# Patient Record
Sex: Female | Born: 1955
Health system: Southern US, Community
[De-identification: ages and names within clinical notes are randomized; demographics above are authoritative.]

## PROBLEM LIST (undated history)

## (undated) DIAGNOSIS — M79604 Pain in right leg: Secondary | ICD-10-CM

## (undated) DIAGNOSIS — I10 Essential (primary) hypertension: Secondary | ICD-10-CM

## (undated) DIAGNOSIS — G8929 Other chronic pain: Secondary | ICD-10-CM

## (undated) DIAGNOSIS — M1990 Unspecified osteoarthritis, unspecified site: Secondary | ICD-10-CM

## (undated) DIAGNOSIS — R0981 Nasal congestion: Secondary | ICD-10-CM

## (undated) DIAGNOSIS — M79605 Pain in left leg: Secondary | ICD-10-CM

## (undated) DIAGNOSIS — Z720 Tobacco use: Secondary | ICD-10-CM

## (undated) HISTORY — DX: Pain in left leg: M79.605

## (undated) HISTORY — DX: Pain in right leg: M79.604

## (undated) HISTORY — DX: Tobacco use: Z72.0

## (undated) HISTORY — DX: Unspecified osteoarthritis, unspecified site: M19.90

## (undated) HISTORY — DX: Nasal congestion: R09.81

## (undated) HISTORY — DX: Other chronic pain: G89.29

## (undated) HISTORY — PX: NASAL SEPTUM SURGERY: SHX37

---

## 1998-04-12 ENCOUNTER — Emergency Department (HOSPITAL_COMMUNITY): Admission: EM | Admit: 1998-04-12 | Discharge: 1998-04-12 | Payer: Self-pay | Admitting: Internal Medicine

## 1998-06-09 ENCOUNTER — Emergency Department (HOSPITAL_COMMUNITY): Admission: EM | Admit: 1998-06-09 | Discharge: 1998-06-09 | Payer: Self-pay

## 1998-09-22 ENCOUNTER — Emergency Department (HOSPITAL_COMMUNITY): Admission: EM | Admit: 1998-09-22 | Discharge: 1998-09-22 | Payer: Self-pay | Admitting: Emergency Medicine

## 1998-09-22 ENCOUNTER — Encounter: Payer: Self-pay | Admitting: Emergency Medicine

## 1999-02-20 ENCOUNTER — Emergency Department (HOSPITAL_COMMUNITY): Admission: EM | Admit: 1999-02-20 | Discharge: 1999-02-20 | Payer: Self-pay | Admitting: Emergency Medicine

## 1999-06-22 ENCOUNTER — Emergency Department (HOSPITAL_COMMUNITY): Admission: EM | Admit: 1999-06-22 | Discharge: 1999-06-22 | Payer: Self-pay | Admitting: Emergency Medicine

## 1999-06-22 ENCOUNTER — Encounter: Payer: Self-pay | Admitting: Emergency Medicine

## 1999-10-09 ENCOUNTER — Emergency Department (HOSPITAL_COMMUNITY): Admission: EM | Admit: 1999-10-09 | Discharge: 1999-10-09 | Payer: Self-pay | Admitting: Emergency Medicine

## 1999-12-15 ENCOUNTER — Emergency Department (HOSPITAL_COMMUNITY): Admission: EM | Admit: 1999-12-15 | Discharge: 1999-12-15 | Payer: Self-pay | Admitting: Emergency Medicine

## 2004-12-25 ENCOUNTER — Emergency Department (HOSPITAL_COMMUNITY): Admission: EM | Admit: 2004-12-25 | Discharge: 2004-12-25 | Payer: Self-pay | Admitting: Emergency Medicine

## 2007-02-07 ENCOUNTER — Emergency Department (HOSPITAL_COMMUNITY): Admission: EM | Admit: 2007-02-07 | Discharge: 2007-02-07 | Payer: Self-pay | Admitting: Emergency Medicine

## 2008-02-08 ENCOUNTER — Emergency Department (HOSPITAL_COMMUNITY): Admission: EM | Admit: 2008-02-08 | Discharge: 2008-02-08 | Payer: Self-pay | Admitting: Emergency Medicine

## 2008-06-19 ENCOUNTER — Emergency Department (HOSPITAL_COMMUNITY): Admission: EM | Admit: 2008-06-19 | Discharge: 2008-06-19 | Payer: Self-pay | Admitting: Emergency Medicine

## 2008-08-10 ENCOUNTER — Emergency Department (HOSPITAL_COMMUNITY): Admission: EM | Admit: 2008-08-10 | Discharge: 2008-08-10 | Payer: Self-pay | Admitting: Emergency Medicine

## 2009-02-07 ENCOUNTER — Emergency Department (HOSPITAL_COMMUNITY): Admission: EM | Admit: 2009-02-07 | Discharge: 2009-02-07 | Payer: Self-pay | Admitting: Emergency Medicine

## 2009-06-10 ENCOUNTER — Emergency Department (HOSPITAL_COMMUNITY): Admission: EM | Admit: 2009-06-10 | Discharge: 2009-06-10 | Payer: Self-pay | Admitting: Emergency Medicine

## 2009-08-26 ENCOUNTER — Emergency Department (HOSPITAL_COMMUNITY): Admission: EM | Admit: 2009-08-26 | Discharge: 2009-08-26 | Payer: Self-pay | Admitting: Emergency Medicine

## 2010-08-29 ENCOUNTER — Emergency Department (HOSPITAL_COMMUNITY): Admission: EM | Admit: 2010-08-29 | Discharge: 2010-01-02 | Payer: Self-pay | Admitting: Emergency Medicine

## 2011-04-10 ENCOUNTER — Emergency Department (HOSPITAL_COMMUNITY)
Admission: EM | Admit: 2011-04-10 | Discharge: 2011-04-10 | Disposition: A | Discharge status: Home / Self Care | Payer: Self-pay | Attending: Emergency Medicine | Admitting: Emergency Medicine

## 2011-04-10 ENCOUNTER — Emergency Department (HOSPITAL_COMMUNITY): Payer: Self-pay

## 2011-04-10 DIAGNOSIS — R5383 Other fatigue: Secondary | ICD-10-CM | POA: Insufficient documentation

## 2011-04-10 DIAGNOSIS — J40 Bronchitis, not specified as acute or chronic: Secondary | ICD-10-CM | POA: Insufficient documentation

## 2011-04-10 DIAGNOSIS — J3489 Other specified disorders of nose and nasal sinuses: Secondary | ICD-10-CM | POA: Insufficient documentation

## 2011-04-10 DIAGNOSIS — R6883 Chills (without fever): Secondary | ICD-10-CM | POA: Insufficient documentation

## 2011-04-10 DIAGNOSIS — R05 Cough: Secondary | ICD-10-CM | POA: Insufficient documentation

## 2011-04-10 DIAGNOSIS — R059 Cough, unspecified: Secondary | ICD-10-CM | POA: Insufficient documentation

## 2011-04-10 DIAGNOSIS — R5381 Other malaise: Secondary | ICD-10-CM | POA: Insufficient documentation

## 2011-04-10 DIAGNOSIS — J329 Chronic sinusitis, unspecified: Secondary | ICD-10-CM | POA: Insufficient documentation

## 2011-04-10 DIAGNOSIS — R112 Nausea with vomiting, unspecified: Secondary | ICD-10-CM | POA: Insufficient documentation

## 2011-06-18 LAB — URINALYSIS, ROUTINE W REFLEX MICROSCOPIC
Glucose, UA: NEGATIVE
Nitrite: NEGATIVE
Specific Gravity, Urine: 1.029
pH: 7

## 2011-06-18 LAB — URINE MICROSCOPIC-ADD ON

## 2011-06-23 LAB — CBC
HCT: 40.1
Platelets: 542 — ABNORMAL HIGH
RDW: 13.2
WBC: 11.4 — ABNORMAL HIGH

## 2011-06-23 LAB — DIFFERENTIAL
Basophils Absolute: 0
Eosinophils Relative: 0
Lymphocytes Relative: 11 — ABNORMAL LOW
Neutro Abs: 9.9 — ABNORMAL HIGH
Neutrophils Relative %: 87 — ABNORMAL HIGH

## 2011-06-23 LAB — BASIC METABOLIC PANEL
BUN: 11
Calcium: 10.2
Creatinine, Ser: 0.67
GFR calc non Af Amer: >60
Glucose, Bld: 131 — ABNORMAL HIGH

## 2012-10-14 ENCOUNTER — Encounter (HOSPITAL_COMMUNITY): Payer: Self-pay | Admitting: Emergency Medicine

## 2012-10-14 ENCOUNTER — Emergency Department (INDEPENDENT_AMBULATORY_CARE_PROVIDER_SITE_OTHER)
Admission: EM | Admit: 2012-10-14 | Discharge: 2012-10-14 | Disposition: A | Discharge status: Home / Self Care | Payer: Self-pay | Source: Home / Self Care | Attending: Family Medicine | Admitting: Family Medicine

## 2012-10-14 ENCOUNTER — Emergency Department (INDEPENDENT_AMBULATORY_CARE_PROVIDER_SITE_OTHER): Payer: Self-pay

## 2012-10-14 DIAGNOSIS — H6690 Otitis media, unspecified, unspecified ear: Secondary | ICD-10-CM

## 2012-10-14 DIAGNOSIS — I10 Essential (primary) hypertension: Secondary | ICD-10-CM

## 2012-10-14 MED ORDER — HYDROCODONE-ACETAMINOPHEN 5-325 MG PO TABS
ORAL_TABLET | ORAL | Status: AC
  Filled 2012-10-14: qty 1

## 2012-10-14 MED ORDER — HYDROCHLOROTHIAZIDE 25 MG PO TABS: 25.0000 mg | ORAL_TABLET | Freq: Every day | ORAL | Status: DC

## 2012-10-14 MED ORDER — HYDROCODONE-ACETAMINOPHEN 5-325 MG PO TABS
2.0000 | ORAL_TABLET | Freq: Once | ORAL | Status: AC
  Administered 2012-10-14: 2 via ORAL

## 2012-10-14 MED ORDER — HYDROCODONE-ACETAMINOPHEN 5-325 MG PO TABS: 2.0000 | ORAL_TABLET | ORAL | Status: DC | PRN

## 2012-10-14 MED ORDER — AMOXICILLIN 500 MG PO CAPS: 500.0000 mg | ORAL_CAPSULE | Freq: Three times a day (TID) | ORAL | Status: DC

## 2012-10-14 NOTE — ED Provider Notes (Signed)
Medical screening examination/treatment/procedure(s) were performed by resident physician or non-physician practitioner and as supervising physician I was immediately available for consultation/collaboration.   Barkley Bruns MD.    Linna Hoff, MD 10/14/12 [phone]

## 2012-10-14 NOTE — ED Notes (Signed)
Pt c/o flu like symptoms. Sinus pressure and pain with bilateral ear soreness. Generalized body aches. Productive cough with yellow and green sputum. Symptoms present x 3 days. Pt has taken otc medication cold and sinus with no relief of symptoms.   Pt BP is elevated 172/111 pt states "no hx of hypertension"

## 2012-10-14 NOTE — ED Provider Notes (Signed)
History     CSN: [id number]  Arrival date & time 10/14/12  1604   First MD Initiated Contact with Patient 10/14/12 1746      Chief Complaint  Patient presents with  . Influenza    sinus pressure and pain. productive cough yellow and green sputum. fever x2 days ago. bodyaches.     (Consider location/radiation/quality/duration/timing/severity/associated sxs/prior treatment) Patient is a 57 y.o. female presenting with cough. The history is provided by the patient. No language interpreter was used.  Cough This is a new problem. The current episode started 2 days ago. The problem occurs constantly. The problem has been gradually worsening. The cough is productive of sputum. The maximum temperature recorded prior to her arrival was 100 to 100.9 F. Pertinent negatives include no rhinorrhea, no sore throat, no shortness of breath and no wheezing. She has tried nothing for the symptoms. She is a smoker. Her past medical history does not include bronchitis.  Pt complains of a cough and an earache.   Pt denies any history of htn.    History reviewed. No pertinent past medical history.  History reviewed. No pertinent past surgical history.  History reviewed. No pertinent family history.  History  Substance Use Topics  . Smoking status: Current Every Day Smoker -- 1.0 packs/day    Types: Cigarettes  . Smokeless tobacco: Not on file  . Alcohol Use: No    OB History    Grav Para Term Preterm Abortions TAB SAB Ect Mult Living                  Review of Systems  HENT: Negative for sore throat and rhinorrhea.   Respiratory: Positive for cough. Negative for shortness of breath and wheezing.   All other systems reviewed and are negative.    Allergies  Review of patient's allergies indicates not on file.  Home Medications  No current outpatient prescriptions on file.  BP 172/111  Pulse 75  Temp 98.8 F (37.1 C) (Oral)  Resp 18  SpO2 99%  Physical Exam  Constitutional: She is  oriented to person, place, and time. She appears well-developed and well-nourished.  HENT:  Head: Normocephalic and atraumatic.  Eyes: Conjunctivae normal are normal. Pupils are equal, round, and reactive to light.  Neck: Normal range of motion.  Cardiovascular: Normal rate and normal heart sounds.   Pulmonary/Chest: Effort normal.        rhonchi  Abdominal: Soft.  Musculoskeletal: Normal range of motion.  Neurological: She is alert and oriented to person, place, and time.  Skin: Skin is warm.  Psychiatric: She has a normal mood and affect.    ED Course  Procedures (including critical care time)  Labs Reviewed - No data to display Dg Chest 2 View  10/14/2012  *RADIOLOGY REPORT*  Clinical Data: Cough for 1 week.  Smoker.  CHEST - 2 VIEW  Comparison: PA and lateral chest 04/10/2011 and 02/08/2008.  Findings: There is enlargement of the retrosternal air space, flattening of the hemidiaphragms and attenuation the pulmonary vasculature consistent with COPD.  Heart size is upper normal.  The cardiac silhouette appears more prominent than on the prior exams likely due to lower lung volumes.  No pneumothorax or pleural effusion.  IMPRESSION:  Findings compatible with COPD.  No focal or acute process.   Original Report Authenticated By: Holley Dexter, M.D.      No diagnosis found.    MDM  Pt given rx for zithromax,hydrocodone and hctz for blood pressure medication  kindl  Lonia Skinner [place], [place] 10/14/12 [phone]

## 2012-10-14 NOTE — ED Notes (Signed)
Waiting discharge papers 

## 2012-10-19 ENCOUNTER — Encounter (HOSPITAL_COMMUNITY): Payer: Self-pay

## 2012-10-19 ENCOUNTER — Emergency Department (HOSPITAL_COMMUNITY)
Admission: EM | Admit: 2012-10-19 | Discharge: 2012-10-20 | Disposition: A | Discharge status: Home / Self Care | Payer: Self-pay | Attending: Emergency Medicine | Admitting: Emergency Medicine

## 2012-10-19 ENCOUNTER — Emergency Department (HOSPITAL_COMMUNITY): Payer: Self-pay

## 2012-10-19 DIAGNOSIS — R059 Cough, unspecified: Secondary | ICD-10-CM | POA: Insufficient documentation

## 2012-10-19 DIAGNOSIS — R112 Nausea with vomiting, unspecified: Secondary | ICD-10-CM

## 2012-10-19 DIAGNOSIS — R05 Cough: Secondary | ICD-10-CM | POA: Insufficient documentation

## 2012-10-19 DIAGNOSIS — F17200 Nicotine dependence, unspecified, uncomplicated: Secondary | ICD-10-CM | POA: Insufficient documentation

## 2012-10-19 DIAGNOSIS — J40 Bronchitis, not specified as acute or chronic: Secondary | ICD-10-CM | POA: Insufficient documentation

## 2012-10-19 LAB — POCT I-STAT, CHEM 8
BUN: 13 mg/dL (ref 6–23)
Chloride: 111 mEq/L (ref 96–112)
Glucose, Bld: 147 mg/dL — ABNORMAL HIGH (ref 70–99)
HCT: 44 % (ref 36.0–46.0)
Potassium: 3 mEq/L — ABNORMAL LOW (ref 3.5–5.1)

## 2012-10-19 LAB — CBC
HCT: 40.9 % (ref 36.0–46.0)
RBC: 5.1 MIL/uL (ref 3.87–5.11)
RDW: 13.2 % (ref 11.5–15.5)
WBC: 13.7 10*3/uL — ABNORMAL HIGH (ref 4.0–10.5)

## 2012-10-19 MED ORDER — HYDROCODONE-ACETAMINOPHEN 5-325 MG PO TABS
1.0000 | ORAL_TABLET | Freq: Once | ORAL | Status: AC
  Administered 2012-10-19: 1 via ORAL
  Filled 2012-10-19: qty 1

## 2012-10-19 MED ORDER — KETOROLAC TROMETHAMINE 30 MG/ML IJ SOLN
30.0000 mg | Freq: Once | INTRAMUSCULAR | Status: AC
  Administered 2012-10-19: 30 mg via INTRAVENOUS
  Filled 2012-10-19: qty 1

## 2012-10-19 MED ORDER — SODIUM CHLORIDE 0.9 % IV BOLUS (SEPSIS)
1000.0000 mL | Freq: Once | INTRAVENOUS | Status: AC
  Administered 2012-10-19: 1000 mL via INTRAVENOUS

## 2012-10-19 MED ORDER — ALBUTEROL SULFATE (5 MG/ML) 0.5% IN NEBU
2.5000 mg | INHALATION_SOLUTION | Freq: Once | RESPIRATORY_TRACT | Status: AC
  Administered 2012-10-19: 2.5 mg via RESPIRATORY_TRACT
  Filled 2012-10-19: qty 0.5

## 2012-10-19 MED ORDER — ONDANSETRON HCL 4 MG/2ML IJ SOLN
4.0000 mg | Freq: Once | INTRAMUSCULAR | Status: AC
  Administered 2012-10-19: 4 mg via INTRAVENOUS
  Filled 2012-10-19: qty 2

## 2012-10-19 MED ORDER — POTASSIUM CHLORIDE CRYS ER 20 MEQ PO TBCR
20.0000 meq | EXTENDED_RELEASE_TABLET | Freq: Once | ORAL | Status: AC
  Administered 2012-10-19: 20 meq via ORAL
  Filled 2012-10-19: qty 1

## 2012-10-19 MED ORDER — POTASSIUM CHLORIDE 10 MEQ/100ML IV SOLN
10.0000 meq | Freq: Once | INTRAVENOUS | Status: AC
  Administered 2012-10-19: 10 meq via INTRAVENOUS
  Filled 2012-10-19: qty 100

## 2012-10-19 MED ORDER — SODIUM CHLORIDE 0.9 % IV SOLN: Freq: Once | INTRAVENOUS | Status: DC

## 2012-10-19 NOTE — ED Notes (Signed)
Pt is rolling around in bed,  IV almost out again, pt states 10/10 pain in sinus,  I ask if any other medical problems she said no even denied fibromyalgia,  States it is just my sinuses

## 2012-10-19 NOTE — ED Notes (Signed)
Pt states that she was diagnosed with bronchitis yesterday

## 2012-10-19 NOTE — ED Provider Notes (Signed)
History     CSN: [id number]  Arrival date & time 10/19/12  2032   First MD Initiated Contact with Patient 10/19/12 2038      Chief Complaint  Patient presents with  . Shortness of Breath    (Consider location/radiation/quality/duration/timing/severity/associated sxs/prior treatment) HPI Comments: Patient has had vomiting today with frequent episodes since this afternoon, was Dx yesterday with bronchitis, now states that all the vomiting has made her feel SOB She has not taken any medication today   Patient is a 57 y.o. female presenting with shortness of breath. The history is provided by the patient.  Shortness of Breath  The current episode started today. The problem occurs frequently. The problem is moderate. Nothing relieves the symptoms. Nothing aggravates the symptoms. Associated symptoms include cough and shortness of breath. Pertinent negatives include no fever, no rhinorrhea and no sore throat.    History reviewed. No pertinent past medical history.  History reviewed. No pertinent past surgical history.  History reviewed. No pertinent family history.  History  Substance Use Topics  . Smoking status: Current Every Day Smoker -- 1.0 packs/day    Types: Cigarettes  . Smokeless tobacco: Not on file  . Alcohol Use: No    OB History    Grav Para Term Preterm Abortions TAB SAB Ect Mult Living                  Review of Systems  Constitutional: Negative for fever and chills.  HENT: Negative for sore throat and rhinorrhea.   Respiratory: Positive for cough and shortness of breath.   Gastrointestinal: Positive for nausea and vomiting. Negative for diarrhea and constipation.  Genitourinary: Negative.   Musculoskeletal: Negative for myalgias.  Skin: Negative for rash and wound.  Neurological: Negative for dizziness and weakness.    Allergies  Review of patient's allergies indicates no known allergies.  Home Medications   Current Outpatient Rx  Name  Route  Sig   Dispense  Refill  . HYDROCHLOROTHIAZIDE 25 MG PO TABS   Oral   Take 1 tablet (25 mg total) by mouth daily.   30 tablet   0     BP 218/88  Pulse 70  Temp 98.8 F (37.1 C) (Rectal)  Resp 20  SpO2 98%  Physical Exam  Constitutional: She is oriented to person, place, and time. She appears well-developed and well-nourished.  HENT:  Head: Normocephalic.  Eyes: Pupils are equal, round, and reactive to light.  Neck: Normal range of motion.  Cardiovascular: Normal rate and regular rhythm.   Pulmonary/Chest: Effort normal and breath sounds normal. She has no wheezes. She exhibits no tenderness.  Abdominal: Soft. She exhibits no distension. There is no tenderness.  Musculoskeletal: Normal range of motion.  Neurological: She is alert and oriented to person, place, and time.  Skin: Skin is warm and dry. No rash noted.    ED Course  Procedures (including critical care time)  Labs Reviewed  CBC - Abnormal; Notable for the following:    WBC 13.7 (*)     Platelets 474 (*)     All other components within normal limits  POCT I-STAT, CHEM 8 - Abnormal; Notable for the following:    Potassium 3.0 (*)     Glucose, Bld 147 (*)     Calcium, Ion 1.07 (*)     All other components within normal limits   Dg Chest 2 View  10/19/2012  *RADIOLOGY REPORT*  Clinical Data: Cough, shortness of breath and chills.  CHEST - 2 VIEW  Comparison: The cardiomediastinal silhouette is unremarkable.10/14/2012 and prior chest radiographs  Findings: The cardiomediastinal silhouette is unremarkable. There is no evidence of focal airspace disease, pulmonary edema, suspicious pulmonary nodule/mass, pleural effusion, or pneumothorax. No acute bony abnormalities are identified.  IMPRESSION: No evidence of acute cardiopulmonary disease.   Original Report Authenticated By: Harmon Pier, M.D.      1. Nausea and vomiting in adult     ED ECG REPORT   Date: 10/20/2012  EKG Time: 3:07 AM  Rate: 71  Rhythm: normal sinus  rhythm,  there are no previous tracings available for comparison  Axis: normal   Intervals:none  ST&T Change LVH with left atrial abnormality   Narrative Interpretation: abnormal             MDM  Patient has been tolerating PO fluid although states is nauseated has not vomited since arrival  K+ was supplemented both PO and IV will DC home with Rx for Zofran clear liquid diet for 24 hours         Arman Filter, NP 10/20/12 0306  Arman Filter, NP 10/20/12 1610  Arman Filter, NP 10/20/12 0308

## 2012-10-19 NOTE — ED Notes (Signed)
Received pt from [place] RN,  Pt has urinated in bed and IV is almost out,  Pt is uncooperative and asking for pain medicine before I can finish assessing,  Pt also states that her IV is burning so potassium is slowed and IV adjusted and warm pack given for comfort measures

## 2012-10-19 NOTE — ED Notes (Signed)
[id number]<JY> Expected date:<BR> Expected time:<BR> Means of arrival:<BR> Comments:<BR> Room 5

## 2012-10-19 NOTE — ED Notes (Signed)
Pt states that she started vomiting about 4pm and about one hour ago became severely short of breath, pt complains of being cold and states "I cant breath" over and over

## 2012-10-20 MED ORDER — PROMETHAZINE HCL 25 MG PO TABS: 25.0000 mg | ORAL_TABLET | Freq: Four times a day (QID) | ORAL | Status: DC | PRN

## 2012-10-20 NOTE — ED Notes (Signed)
Pt unscrewed her IV potassium line that was y into saline line stated it came undone,  IV was screwed back in and restarted, pt has continued to flail around in bed stating that she has a sinus infection and is nausea,  Pt has not vomited since in my care

## 2012-10-21 NOTE — ED Provider Notes (Signed)
Medical screening examination/treatment/procedure(s) were performed by non-physician practitioner and as supervising physician I was immediately available for consultation/collaboration.   Suzi Roots, MD 10/21/12 [phone]

## 2012-10-22 ENCOUNTER — Emergency Department (HOSPITAL_COMMUNITY): Payer: Self-pay

## 2012-10-22 ENCOUNTER — Emergency Department (HOSPITAL_COMMUNITY)
Admission: EM | Admit: 2012-10-22 | Discharge: 2012-10-23 | Disposition: A | Discharge status: Home / Self Care | Payer: Self-pay | Attending: Emergency Medicine | Admitting: Emergency Medicine

## 2012-10-22 ENCOUNTER — Encounter (HOSPITAL_COMMUNITY): Payer: Self-pay | Admitting: Family Medicine

## 2012-10-22 DIAGNOSIS — R059 Cough, unspecified: Secondary | ICD-10-CM | POA: Insufficient documentation

## 2012-10-22 DIAGNOSIS — R5381 Other malaise: Secondary | ICD-10-CM | POA: Insufficient documentation

## 2012-10-22 DIAGNOSIS — R1084 Generalized abdominal pain: Secondary | ICD-10-CM | POA: Insufficient documentation

## 2012-10-22 DIAGNOSIS — R5383 Other fatigue: Secondary | ICD-10-CM | POA: Insufficient documentation

## 2012-10-22 DIAGNOSIS — R197 Diarrhea, unspecified: Secondary | ICD-10-CM | POA: Insufficient documentation

## 2012-10-22 DIAGNOSIS — F17200 Nicotine dependence, unspecified, uncomplicated: Secondary | ICD-10-CM | POA: Insufficient documentation

## 2012-10-22 DIAGNOSIS — R030 Elevated blood-pressure reading, without diagnosis of hypertension: Secondary | ICD-10-CM | POA: Insufficient documentation

## 2012-10-22 DIAGNOSIS — R05 Cough: Secondary | ICD-10-CM | POA: Insufficient documentation

## 2012-10-22 DIAGNOSIS — R0602 Shortness of breath: Secondary | ICD-10-CM | POA: Insufficient documentation

## 2012-10-22 DIAGNOSIS — R112 Nausea with vomiting, unspecified: Secondary | ICD-10-CM | POA: Insufficient documentation

## 2012-10-22 DIAGNOSIS — R1110 Vomiting, unspecified: Secondary | ICD-10-CM

## 2012-10-22 HISTORY — DX: Essential (primary) hypertension: I10

## 2012-10-22 LAB — LACTIC ACID, PLASMA: Lactic Acid, Venous: 2 mmol/L (ref 0.5–2.2)

## 2012-10-22 LAB — CBC WITH DIFFERENTIAL/PLATELET
Basophils Absolute: 0 10*3/uL (ref 0.0–0.1)
Basophils Relative: 0 % (ref 0–1)
Eosinophils Relative: 0 % (ref 0–5)
HCT: 42.9 % (ref 36.0–46.0)
Hemoglobin: 15.2 g/dL — ABNORMAL HIGH (ref 12.0–15.0)
Lymphocytes Relative: 18 % (ref 12–46)
MCHC: 35.4 g/dL (ref 30.0–36.0)
MCV: 79.7 fL (ref 78.0–100.0)
Monocytes Absolute: 0.8 10*3/uL (ref 0.1–1.0)
Monocytes Relative: 8 % (ref 3–12)
Neutro Abs: 7.6 10*3/uL (ref 1.7–7.7)
RDW: 13.4 % (ref 11.5–15.5)

## 2012-10-22 LAB — URINALYSIS, ROUTINE W REFLEX MICROSCOPIC
Ketones, ur: >80 mg/dL — AB
Leukocytes, UA: NEGATIVE
Nitrite: NEGATIVE
Protein, ur: NEGATIVE mg/dL
Urobilinogen, UA: 1 mg/dL (ref 0.0–1.0)

## 2012-10-22 LAB — COMPREHENSIVE METABOLIC PANEL
AST: 19 U/L (ref 0–37)
BUN: 20 mg/dL (ref 6–23)
CO2: 22 mEq/L (ref 19–32)
Calcium: 10.7 mg/dL — ABNORMAL HIGH (ref 8.4–10.5)
Chloride: 96 mEq/L (ref 96–112)
Creatinine, Ser: 0.54 mg/dL (ref 0.50–1.10)
GFR calc non Af Amer: >90 mL/min (ref >90)
Total Bilirubin: 0.9 mg/dL (ref 0.3–1.2)

## 2012-10-22 LAB — TROPONIN I: Troponin I: <0.3 ng/mL (ref <0.30)

## 2012-10-22 MED ORDER — IOHEXOL 300 MG/ML  SOLN
80.0000 mL | Freq: Once | INTRAMUSCULAR | Status: AC | PRN
  Administered 2012-10-22: 80 mL via INTRAVENOUS

## 2012-10-22 MED ORDER — MORPHINE SULFATE 4 MG/ML IJ SOLN
4.0000 mg | Freq: Once | INTRAMUSCULAR | Status: AC
  Administered 2012-10-22: 4 mg via INTRAVENOUS
  Filled 2012-10-22: qty 1

## 2012-10-22 MED ORDER — IOHEXOL 300 MG/ML  SOLN
50.0000 mL | Freq: Once | INTRAMUSCULAR | Status: AC | PRN
  Administered 2012-10-22: 50 mL via ORAL

## 2012-10-22 MED ORDER — PROMETHAZINE HCL 25 MG/ML IJ SOLN
12.5000 mg | Freq: Once | INTRAMUSCULAR | Status: AC
  Administered 2012-10-22: 12.5 mg via INTRAVENOUS
  Filled 2012-10-22: qty 1

## 2012-10-22 MED ORDER — ONDANSETRON HCL 4 MG/2ML IJ SOLN
4.0000 mg | Freq: Once | INTRAMUSCULAR | Status: AC
  Administered 2012-10-22: 4 mg via INTRAVENOUS
  Filled 2012-10-22: qty 2

## 2012-10-22 MED ORDER — METOCLOPRAMIDE HCL 5 MG/ML IJ SOLN
10.0000 mg | Freq: Once | INTRAMUSCULAR | Status: AC
  Administered 2012-10-22: 10 mg via INTRAVENOUS
  Filled 2012-10-22: qty 2

## 2012-10-22 MED ORDER — PROMETHAZINE HCL 25 MG RE SUPP: 25.0000 mg | Freq: Four times a day (QID) | RECTAL | Status: DC | PRN

## 2012-10-22 MED ORDER — SODIUM CHLORIDE 0.9 % IV BOLUS (SEPSIS)
1000.0000 mL | Freq: Once | INTRAVENOUS | Status: AC
  Administered 2012-10-22: 1000 mL via INTRAVENOUS

## 2012-10-22 NOTE — ED Notes (Signed)
Spoke with MD about Pt. Stating she was still to nauseated to drink contrast for her CT. Stated it was OK for her to have it without oral contrast at this time. CT made aware of MD decision.

## 2012-10-22 NOTE — ED Notes (Signed)
Pt voided in a bedpan to give sample and family member poured the urine out. Unable to get sample.

## 2012-10-22 NOTE — ED Provider Notes (Signed)
History     CSN: [id number]  Arrival date & time 10/22/12  1706   First MD Initiated Contact with Patient 10/22/12 1708      No chief complaint on file.   (Consider location/radiation/quality/duration/timing/severity/associated sxs/prior treatment) HPI Comments: Patient brought to the ER for evaluation of nausea and vomiting with abdominal pain. Patient reports that her symptoms have been present for 3 days. Patient was recently diagnosed with bronchitis. She then presented to [facility] Emergency department the day after with complaints of nausea and vomiting. She was discharged with Phenergan which she has been using, but she reports that has not been helping. Patient reports diffuse abdominal pain and uncontrollable nausea and vomiting. She cannot hold anything down. Pain is sharp, stabbing and severe at times.   No past medical history on file.  No past surgical history on file.  No family history on file.  History  Substance Use Topics  . Smoking status: Current Every Day Smoker -- 1.0 packs/day    Types: Cigarettes  . Smokeless tobacco: Not on file  . Alcohol Use: No    OB History    Grav Para Term Preterm Abortions TAB SAB Ect Mult Living                  Review of Systems  Constitutional: Negative for fever.  Respiratory: Positive for cough and shortness of breath.   Gastrointestinal: Positive for nausea, vomiting, abdominal pain and diarrhea.  Neurological: Positive for weakness.  All other systems reviewed and are negative.    Allergies  Review of patient's allergies indicates no known allergies.  Home Medications   Current Outpatient Rx  Name  Route  Sig  Dispense  Refill  . HYDROCHLOROTHIAZIDE 25 MG PO TABS   Oral   Take 1 tablet (25 mg total) by mouth daily.   30 tablet   0   . PROMETHAZINE HCL 25 MG PO TABS   Oral   Take 1 tablet (25 mg total) by mouth every 6 (six) hours as needed for nausea.   30 tablet   0     There were no vitals  taken for this visit.  Physical Exam  Constitutional: She is oriented to person, place, and time. She appears well-developed and well-nourished. She appears distressed.  HENT:  Head: Normocephalic and atraumatic.  Right Ear: Hearing normal.  Nose: Nose normal.  Mouth/Throat: Oropharynx is clear and moist and mucous membranes are normal.  Eyes: Conjunctivae normal and EOM are normal. Pupils are equal, round, and reactive to light.  Neck: Normal range of motion. Neck supple.  Cardiovascular: Normal rate, regular rhythm, S1 normal and S2 normal.  Exam reveals no gallop and no friction rub.   No murmur heard. Pulmonary/Chest: Effort normal and breath sounds normal. No respiratory distress. She exhibits no tenderness.  Abdominal: Soft. Normal appearance and bowel sounds are normal. There is no hepatosplenomegaly. There is generalized tenderness. There is no rebound, no guarding, no tenderness at McBurney's point and negative Murphy's sign. No hernia.  Musculoskeletal: Normal range of motion.  Neurological: She is alert and oriented to person, place, and time. She has normal strength. No cranial nerve deficit or sensory deficit. Coordination normal. GCS eye subscore is 4. GCS verbal subscore is 5. GCS motor subscore is 6.  Skin: Skin is warm, dry and intact. No rash noted. No cyanosis.  Psychiatric: She has a normal mood and affect. Her speech is normal and behavior is normal. Thought content normal.  ED Course  Procedures (including critical care time)   Date: 10/22/2012  Rate: 75  Rhythm: normal sinus rhythm  QRS Axis: normal  Intervals: normal  ST/T Wave abnormalities: LVH with Repolarization abn  Conduction Disutrbances:none  Narrative Interpretation:   Old EKG Reviewed: unchanged    Labs Reviewed  CBC WITH DIFFERENTIAL - Abnormal; Notable for the following:    RBC 5.38 (*)     Hemoglobin 15.2 (*)     Platelets 532 (*)     All other components within normal limits   COMPREHENSIVE METABOLIC PANEL - Abnormal; Notable for the following:    Potassium 2.7 (*)     Glucose, Bld 128 (*)     Calcium 10.7 (*)     Total Protein 9.3 (*)     All other components within normal limits  URINALYSIS, ROUTINE W REFLEX MICROSCOPIC - Abnormal; Notable for the following:    Ketones, ur >80 (*)     All other components within normal limits  PRO B NATRIURETIC PEPTIDE - Abnormal; Notable for the following:    Pro B Natriuretic peptide (BNP) 494.8 (*)     All other components within normal limits  TROPONIN I  LACTIC ACID, PLASMA   Dg Chest 2 View  10/22/2012  *RADIOLOGY REPORT*  Clinical Data: Shortness of breath.  CHEST - 2 VIEW  Comparison: 10/19/2012  Findings: Mild cardiomegaly and ectasia of the thoracic aorta are stable.  Both lungs are clear.  No evidence of pleural effusion. No mass or lymphadenopathy identified.  IMPRESSION: Stable mild cardiomegaly.  No active lung disease.   Original Report Authenticated By: Myles Rosenthal, M.D.    Ct Abdomen Pelvis W Contrast  10/22/2012  *RADIOLOGY REPORT*  Clinical Data: Abdominal pain, nausea and vomiting for 5 days  CT ABDOMEN AND PELVIS WITH CONTRAST  Technique:  Multidetector CT imaging of the abdomen and pelvis was performed following the standard protocol during bolus administration of intravenous contrast.  Contrast: 80mL OMNIPAQUE IOHEXOL 300 MG/ML  SOLN  Comparison: None.  Findings:  Normal hepatic contour.  There is a minimal amount of focal fatty infiltration adjacent to the fissure for the ligamentum teres.  No worrisome hepatic lesions.  Normal appearance of the gallbladder. Common bile duct is mildly dilated measuring approximately 9 mm in greatest short axis axial dimension (image 27, series 12).  The etiology of this mild dilatation is not present on this examination.  No intrahepatic biliary duct dilatation.  Normal appearance of the pancreas.  No pancreatic ductal dilatation.  There is symmetric enhancement and excretion  of the bilateral kidneys.  No definite renal stones on the post contrast examination.  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  No urinary obstruction.  No perinephric stranding.  Normal appearance of the bilateral adrenal glands and spleen.  Only a minimal amount of enteric contrast has been ingested and is seen extending to the level of the distal small bowel.  No evidence of enteric obstruction.  There is a thin walled patulous abnormal appearing loop of bowel within the right mid hemiabdomen which measures approximately 6.9 x 1.9 x 5.2 cm (as measured in greatest transverse, AP and craniocaudad dimensions respectively, image 40, series 2; coronal image 23). This structure is favored to represent a presumed cecal diverticulum as an otherwise normal-appearing appendix appears to arise from this structure (coronal images 25 through 31, series  5, axial image 51, series 2).  There is no associated inflammatory change.   No pneumoperitoneum or pneumatosis.  Limited visualization of the lower thorax suggests centrilobular emphysematous change.  Minimal dependent ground-glass atelectasis, left greater than right.  No focal airspace opacities.  No pleural effusion.  Normal heart size.  No pericardial effusion.  No acute or aggressive osseous abnormalities.  IMPRESSION:  1.  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  Further evaluation with colonoscopy is recommended. 2.  Normal appearance of the appendix.  3.  Mild dilatation of the common bile duct (measuring 9 mm) the etiology of which is not depicted on the examination.  Further evaluation with a non emergent MRCP may be performed as clinically indicated.   Original Report Authenticated By: Tacey Ruiz, MD      Diagnosis: 1. Nausea and  Vomiting 2. Hypertension    MDM  Patient presents to ER with complaints of vomiting for 4 days. She was seen previously at the ER at [facility] long for the same and workup was unremarkable. Patient reports that the Phenergan is not helping. She reports diffuse abdominal discomfort to arrival. Blood work was largely unremarkable. CAT scan was performed that did not show any acute pathology. Patient did not respond to the IV fluids and Zofran, continued to have nausea and vomiting. She still complained of nausea after Reglan, but after Phenergan was administered, patient was much improved. She is complaining of pain, but reports that her pain is "all over". She is any specific abdominal pain, it's aching over her entire body. This seems consistent with myalgias. He was previously diagnosed with bronchitis and I suspect that her symptoms are caused by influenza.  Vision is feeling much better now. Repeat examination reveals she is resting comfortably. Blood pressure was elevated, but she has not taken her blood pressure medication today. She will take her blood pressure medication when she gets home.        Gilda Crease, MD 10/22/12 [phone]

## 2012-10-22 NOTE — ED Notes (Signed)
Patient transported to CT 

## 2012-10-22 NOTE — ED Notes (Signed)
Per EMS, 4 days of vomiting. Seen recently at [facility] for the same. Hypertensive but hasn't been able to keep down medications. IV LAC 20.

## 2014-04-10 IMAGING — CR DG CHEST 2V
2 series · 2 of 2 positions shown · non-contrast
Comparison: PA and lateral chest 04/10/2011 and 02/08/2008.

CLINICAL DATA: Cough for 1 week.  Smoker.

CHEST - 2 VIEW

[view not recorded (1 of 2)]
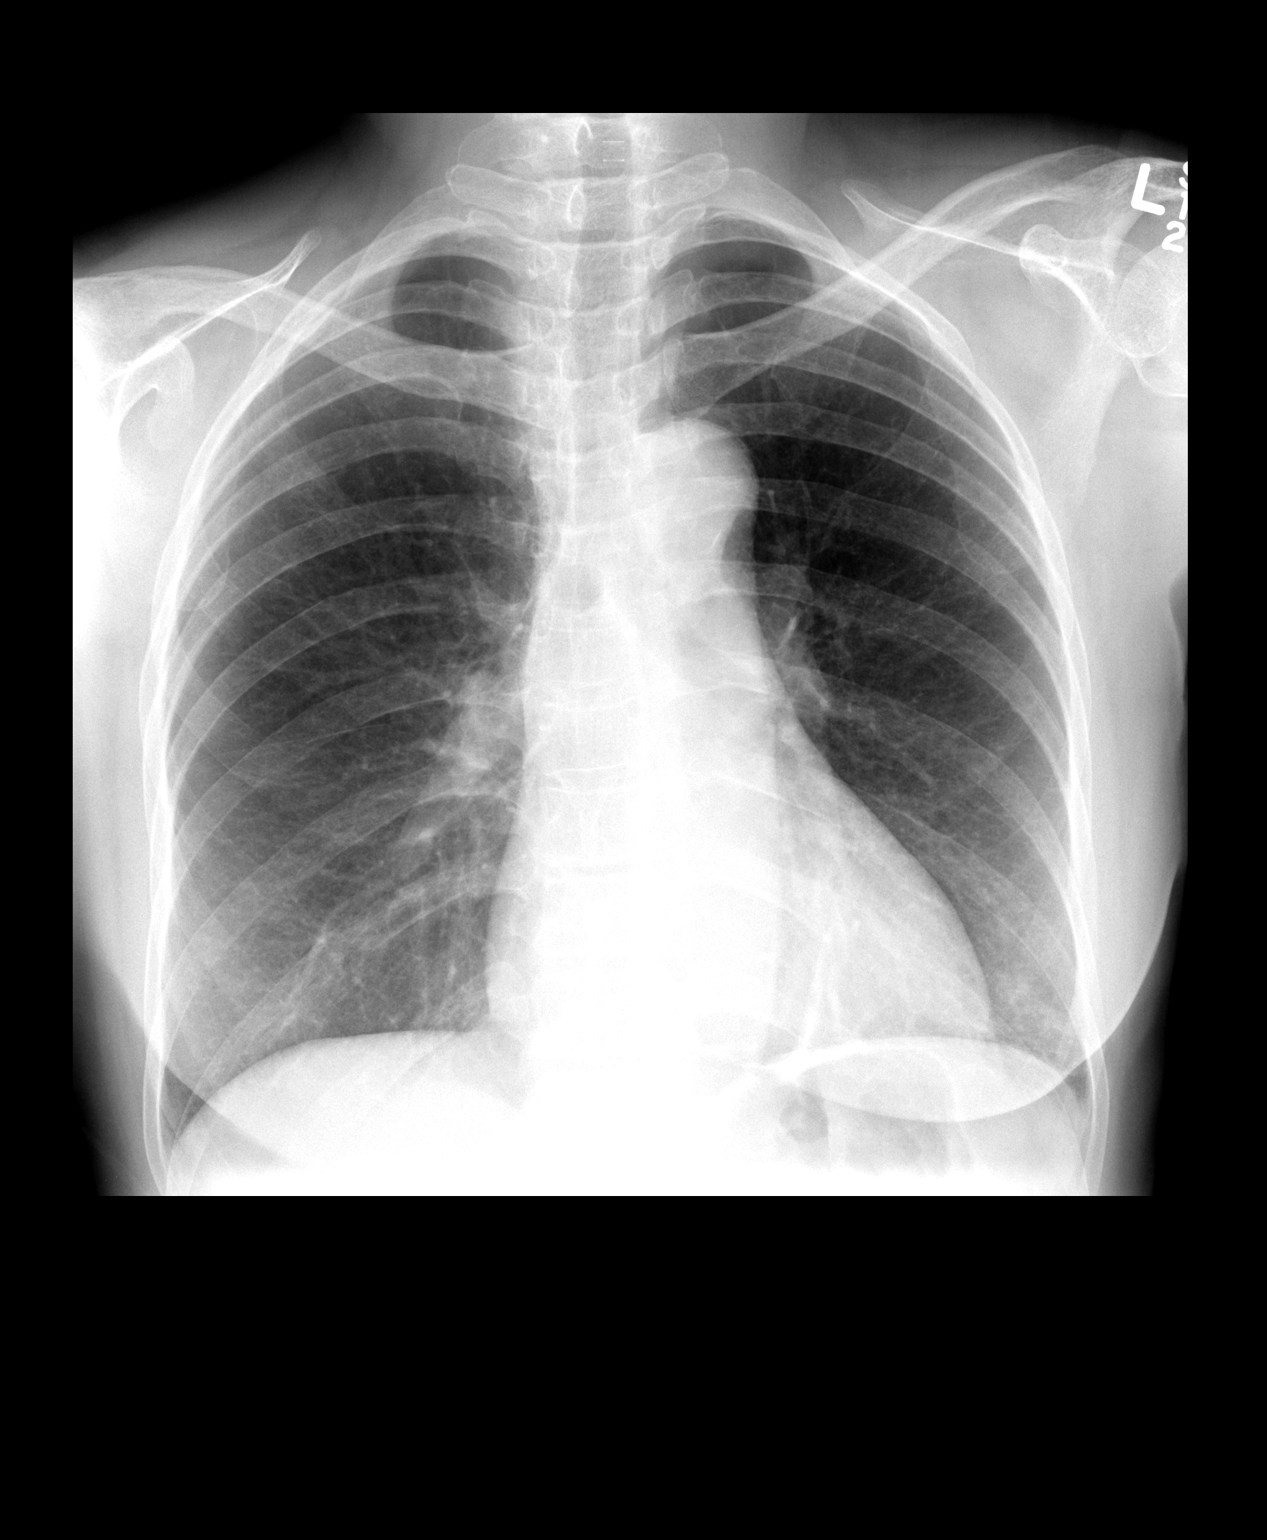

[view not recorded (2 of 2)]
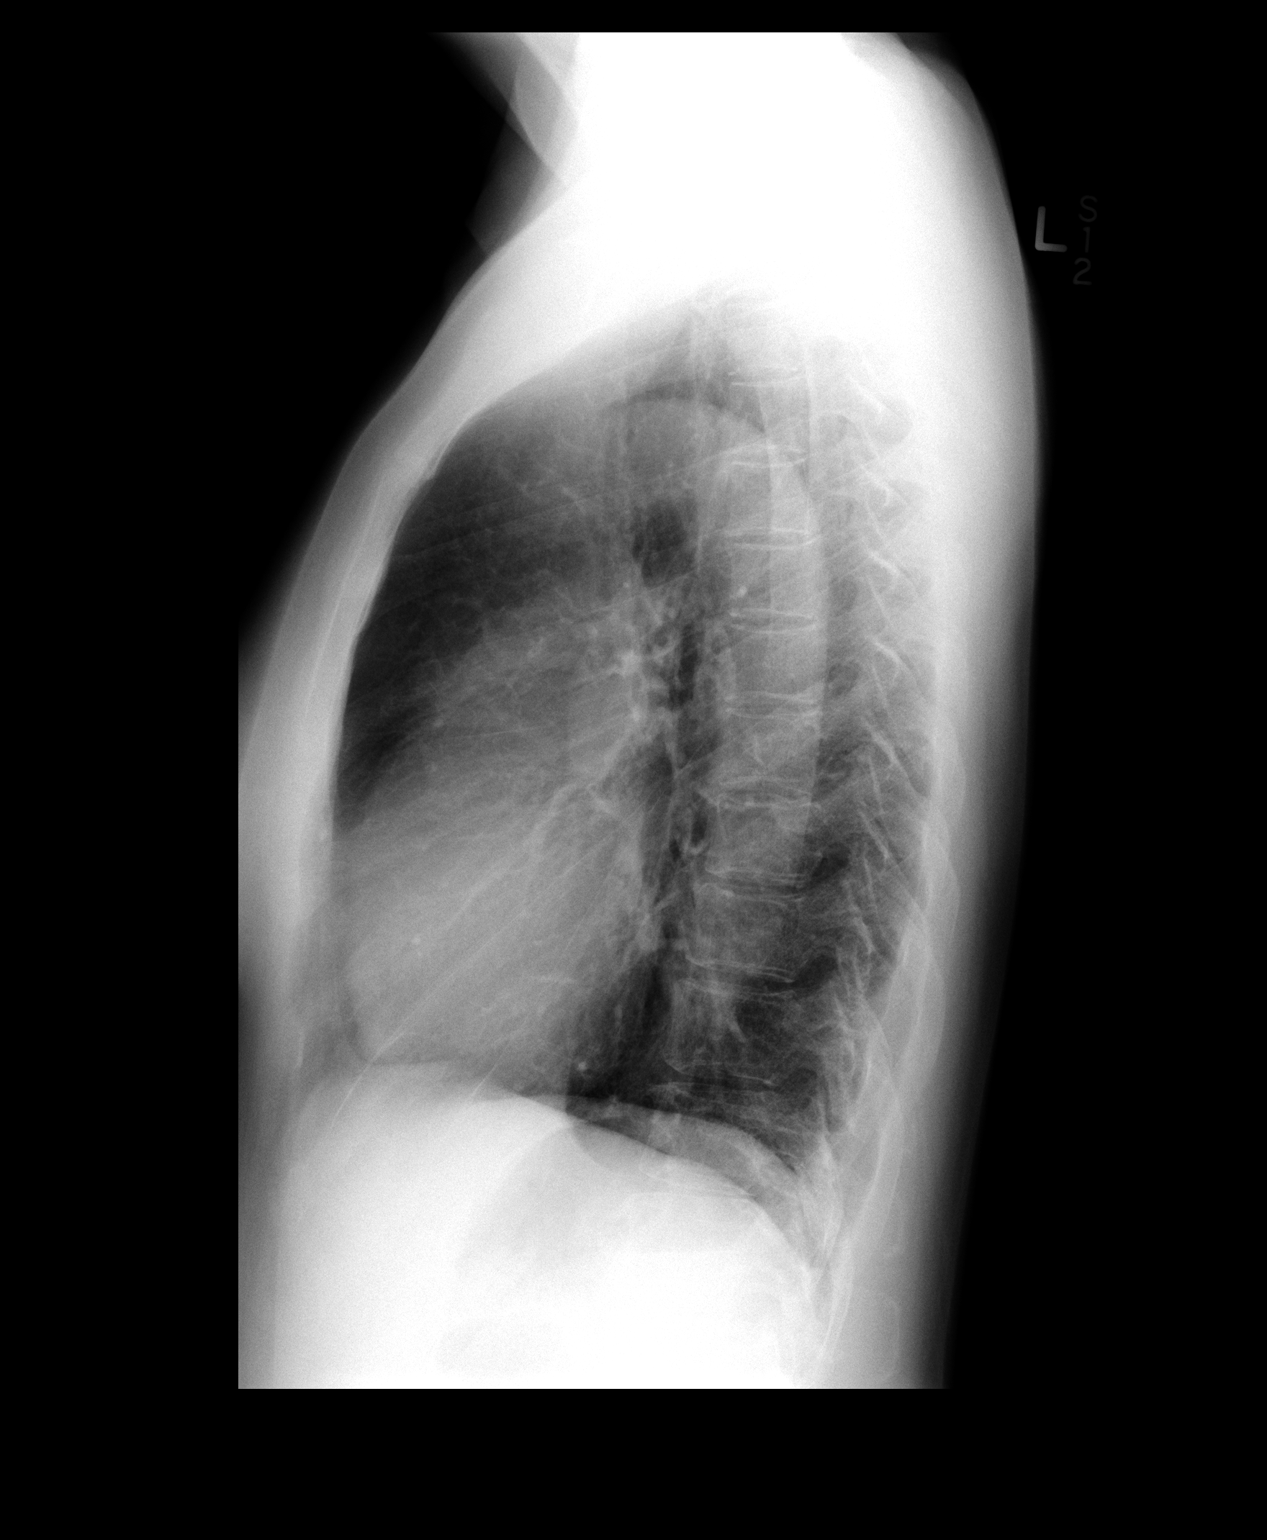

[2 of 2 positions shown; findings below may reference images not displayed]

FINDINGS: There is enlargement of the retrosternal air space,
flattening of the hemidiaphragms and attenuation the pulmonary
vasculature consistent with COPD.  Heart size is upper normal.  The
cardiac silhouette appears more prominent than on the prior exams
likely due to lower lung volumes.  No pneumothorax or pleural
effusion.
IMPRESSION: Findings compatible with COPD.  No focal or acute process.

## 2014-04-18 IMAGING — CT CT ABD-PELV W/ CM
2 of 5 series · 14 of 46 positions shown, 16 images · IV contrast (APPLIED)
Comparison: None.

CLINICAL DATA: Abdominal pain, nausea and vomiting for 5 days

CT ABDOMEN AND PELVIS WITH CONTRAST
TECHNIQUE: Multidetector CT imaging of the abdomen and pelvis was
performed following the standard protocol during bolus
administration of intravenous contrast.
Contrast: 80mL OMNIPAQUE IOHEXOL 300 MG/ML  SOLN

[Series 2: abd/pelv with 5.0 b31f st · axial · 0.65mm/px · z∈[+582,+956]mm · 11 of 85 slices shown, 13 images]
[im 5/85  soft-tissue]
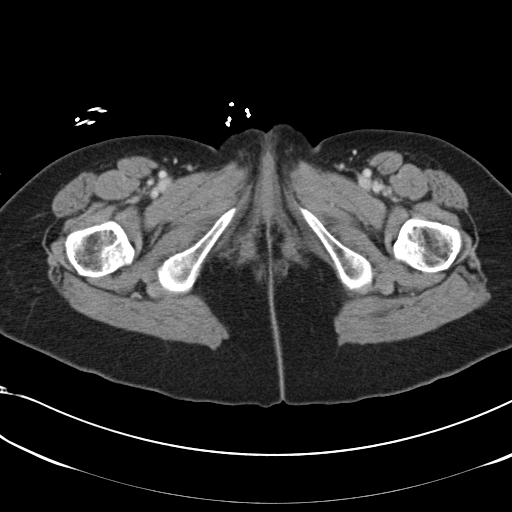
[im 5/85  bone]
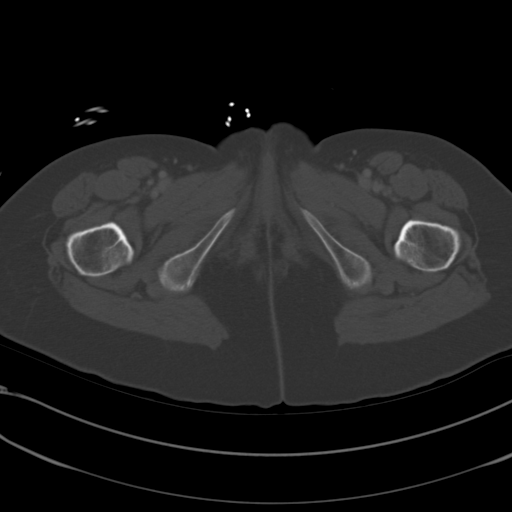
[im 13/85  soft-tissue]
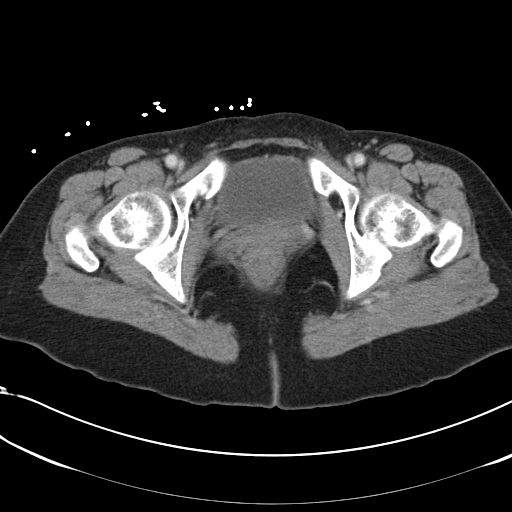
[im 22/85  soft-tissue]
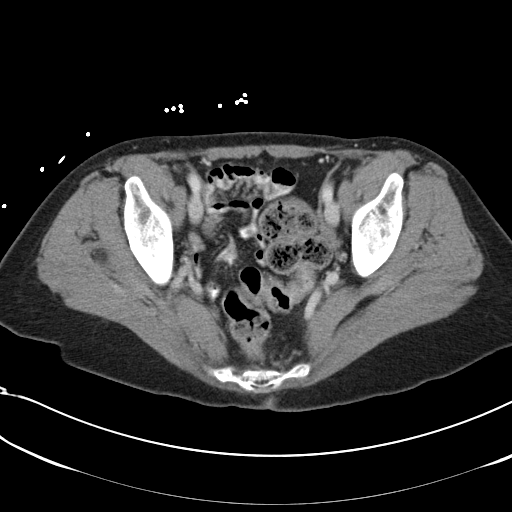
[im 30/85  soft-tissue]
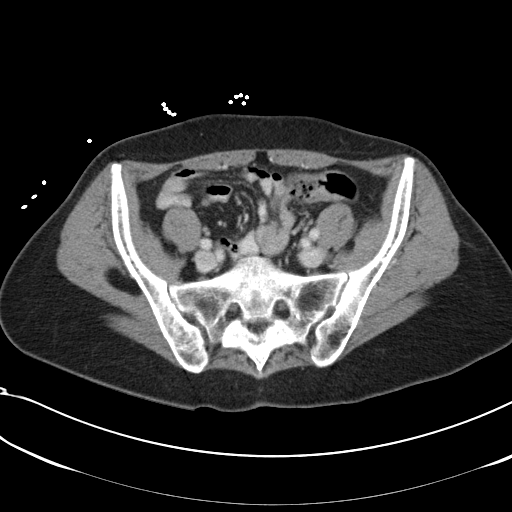
[im 34/85  soft-tissue]
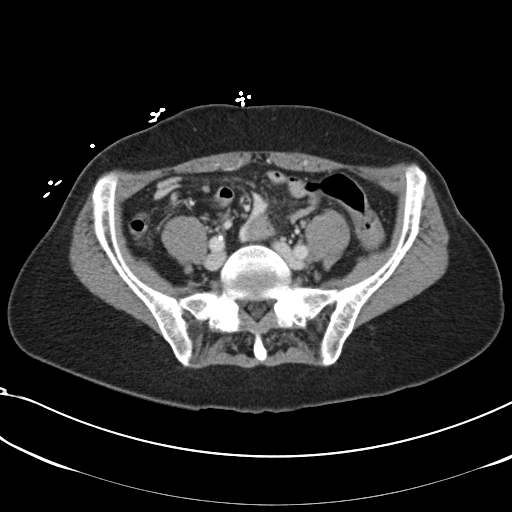
[im 43/85  soft-tissue]
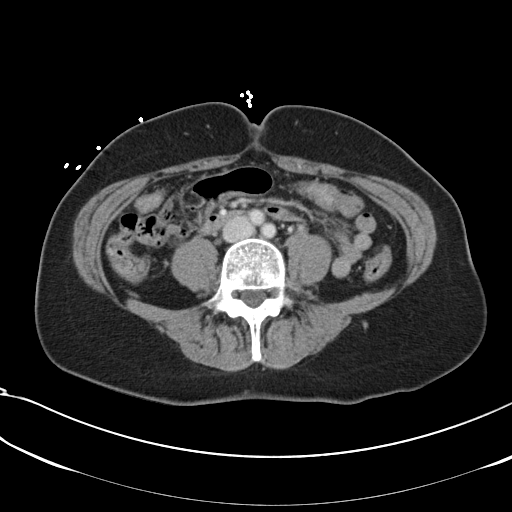
[im 51/85  soft-tissue]
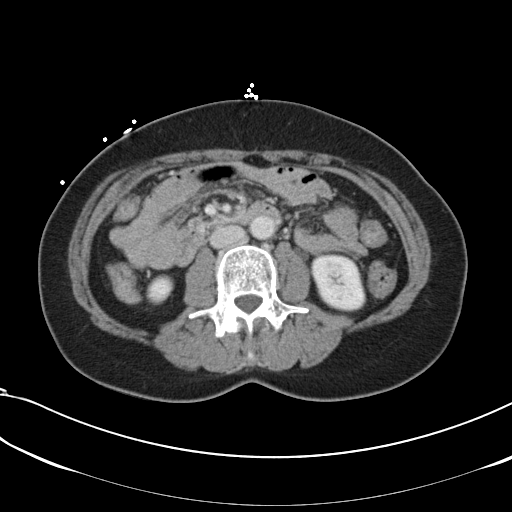
[im 55/85  soft-tissue]
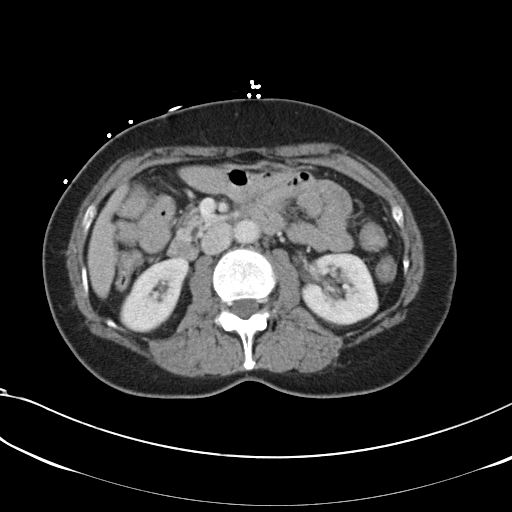
[im 64/85  soft-tissue]
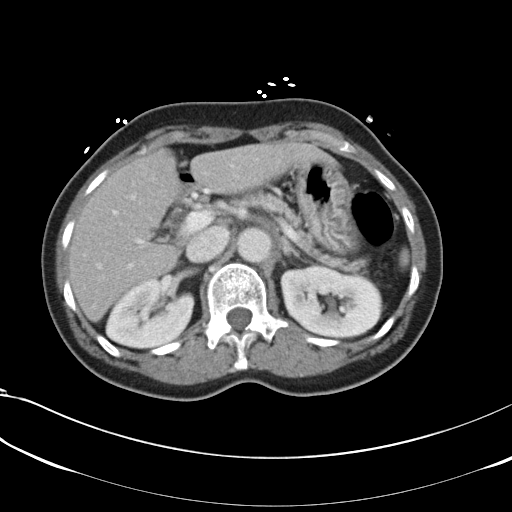
[im 64/85  bone]
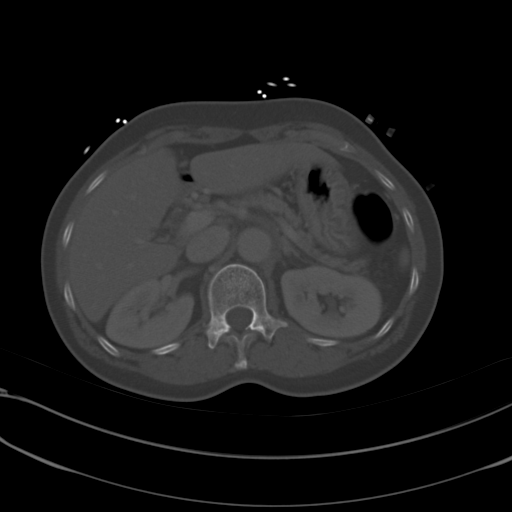
[im 72/85  soft-tissue]
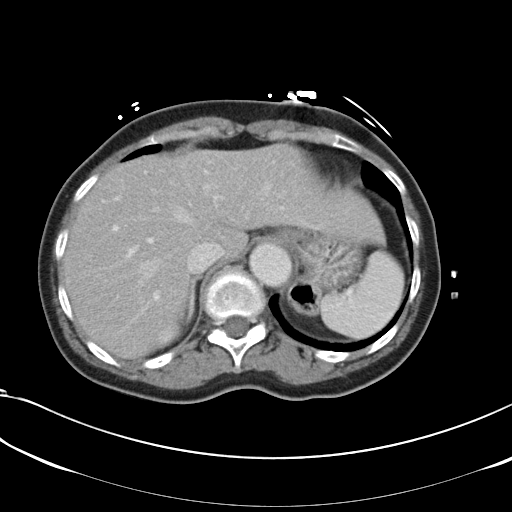
[im 80/85  soft-tissue]
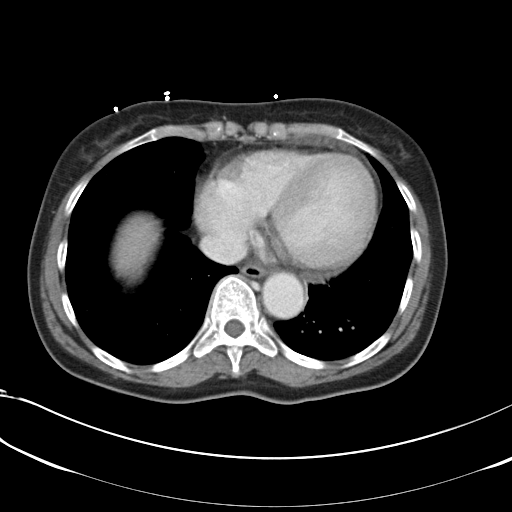

[Series 5: coronals · coronal · 0.53mm/px · 3 of 91 slices shown]
[im 31/91  soft-tissue]
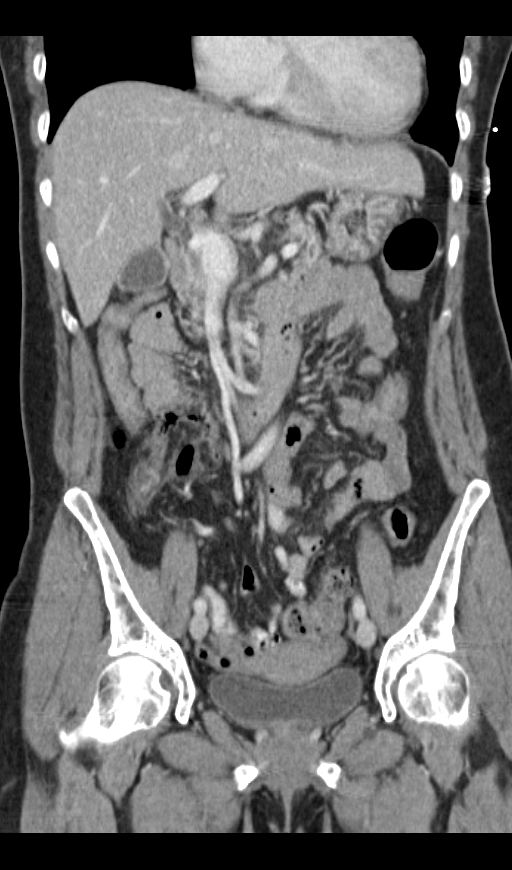
[im 41/91  soft-tissue]
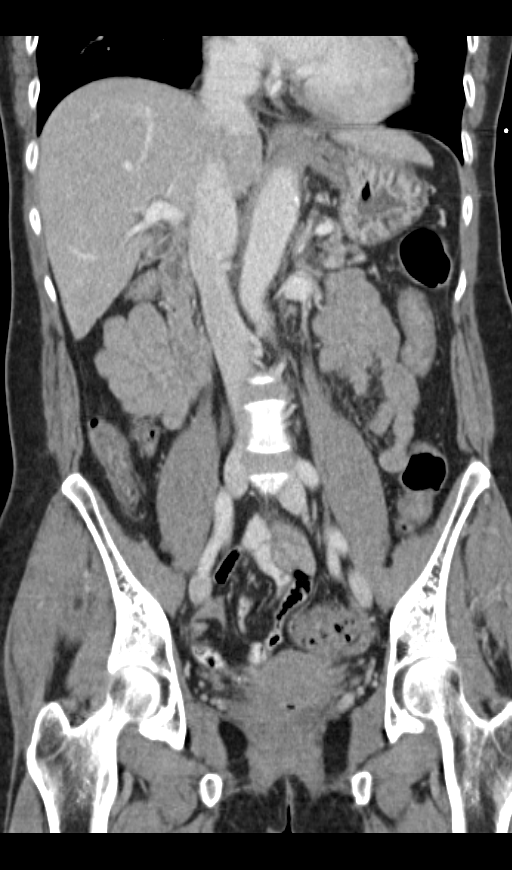
[im 51/91  soft-tissue]
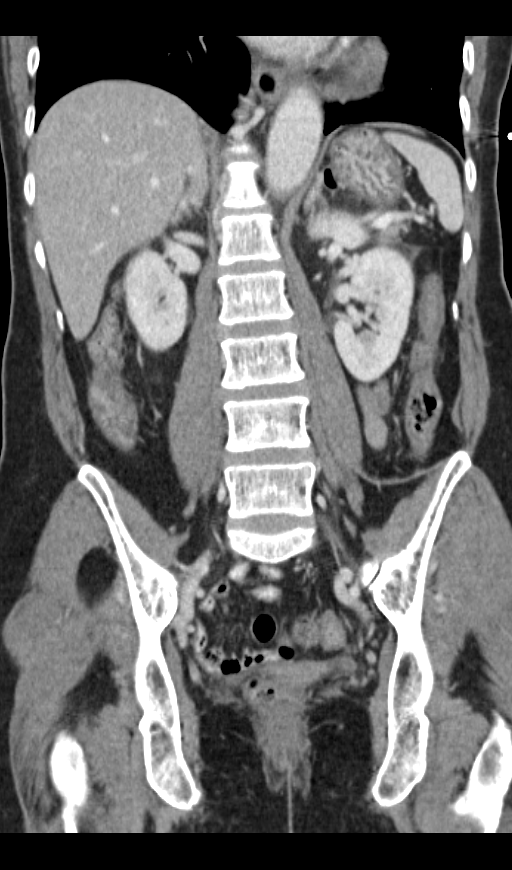

[14 of 46 positions shown; findings below may reference images not displayed]

FINDINGS: Normal hepatic contour.  There is a minimal amount of focal fatty
infiltration adjacent to the fissure for the ligamentum teres.  No
worrisome hepatic lesions.  Normal appearance of the gallbladder.
Common bile duct is mildly dilated measuring approximately 9 mm in
greatest short axis axial dimension (image 27, series 12).  The
etiology of this mild dilatation is not present on this
examination.  No intrahepatic biliary duct dilatation.  Normal
appearance of the pancreas.  No pancreatic ductal dilatation.

There is symmetric enhancement and excretion of the bilateral
kidneys.  No definite renal stones on the post contrast
examination.  Incidental note is made of partial duplication of the
left renal collecting system with to ureters noted to the level of
the mid aspect of the left abdomen.  No urinary obstruction.  No
perinephric stranding.  Normal appearance of the bilateral adrenal
glands and spleen.

Only a minimal amount of enteric contrast has been ingested and is
seen extending to the level of the distal small bowel.  No evidence
of enteric obstruction.

There is a thin walled patulous abnormal appearing loop of bowel
within the right mid hemiabdomen which measures approximately 6.9 x
1.9 x 5.2 cm (as measured in greatest transverse, AP and
craniocaudad dimensions respectively, image 40, series 2; coronal
image 23). This structure is favored to represent a presumed cecal
diverticulum as an otherwise normal-appearing appendix appears to
arise from this structure (coronal images 25 through 31, series 5,
axial image 51, series 2).  There is no associated inflammatory
change.   No pneumoperitoneum or pneumatosis.

Limited visualization of the lower thorax suggests centrilobular
emphysematous change.  Minimal dependent ground-glass atelectasis,
left greater than right.  No focal airspace opacities.  No pleural
effusion.  Normal heart size.  No pericardial effusion.

No acute or aggressive osseous abnormalities.
IMPRESSION: 1.  Suspected approximately 6.9-cm thin-walled cecal diverticulum
without associated inflammatory change, evidence of perforation or
enteric obstruction.  Further evaluation with colonoscopy is
recommended.
2.  Normal appearance of the appendix.

3.  Mild dilatation of the common bile duct (measuring 9 mm) the
etiology of which is not depicted on the examination.  Further
evaluation with a non emergent MRCP may be performed as clinically
indicated.

## 2014-09-19 ENCOUNTER — Telehealth: Payer: Self-pay

## 2015-04-05 ENCOUNTER — Ambulatory Visit: Payer: Self-pay

## 2015-04-10 ENCOUNTER — Ambulatory Visit: Payer: Self-pay

## 2015-04-17 ENCOUNTER — Ambulatory Visit: Payer: Self-pay

## 2016-02-25 ENCOUNTER — Encounter (HOSPITAL_COMMUNITY): Payer: Self-pay | Admitting: Emergency Medicine

## 2016-02-25 ENCOUNTER — Inpatient Hospital Stay (HOSPITAL_COMMUNITY)
Admission: EM | Admit: 2016-02-25 | Discharge: 2016-02-28 | DRG: 305 | Disposition: A | Discharge status: Home / Self Care | Payer: Self-pay | Attending: Internal Medicine | Admitting: Internal Medicine

## 2016-02-25 ENCOUNTER — Emergency Department (HOSPITAL_COMMUNITY): Payer: MEDICAID

## 2016-02-25 DIAGNOSIS — R51 Headache: Secondary | ICD-10-CM | POA: Diagnosis present

## 2016-02-25 DIAGNOSIS — F17210 Nicotine dependence, cigarettes, uncomplicated: Secondary | ICD-10-CM | POA: Diagnosis present

## 2016-02-25 DIAGNOSIS — I10 Essential (primary) hypertension: Secondary | ICD-10-CM | POA: Diagnosis present

## 2016-02-25 DIAGNOSIS — L120 Bullous pemphigoid: Secondary | ICD-10-CM

## 2016-02-25 DIAGNOSIS — I161 Hypertensive emergency: Principal | ICD-10-CM | POA: Diagnosis present

## 2016-02-25 DIAGNOSIS — M549 Dorsalgia, unspecified: Secondary | ICD-10-CM | POA: Diagnosis present

## 2016-02-25 DIAGNOSIS — Z9119 Patient's noncompliance with other medical treatment and regimen: Secondary | ICD-10-CM

## 2016-02-25 DIAGNOSIS — E876 Hypokalemia: Secondary | ICD-10-CM | POA: Diagnosis present

## 2016-02-25 LAB — I-STAT CHEM 8, ED
BUN: 9 mg/dL (ref 6–20)
CHLORIDE: 101 mmol/L (ref 101–111)
CREATININE: 0.7 mg/dL (ref 0.44–1.00)
Calcium, Ion: 1.14 mmol/L (ref 1.13–1.30)
Glucose, Bld: 115 mg/dL — ABNORMAL HIGH (ref 65–99)
HEMATOCRIT: 44 % (ref 36.0–46.0)
HEMOGLOBIN: 15 g/dL (ref 12.0–15.0)
POTASSIUM: 3 mmol/L — AB (ref 3.5–5.1)
Sodium: 140 mmol/L (ref 135–145)
TCO2: 24 mmol/L (ref 0–100)

## 2016-02-25 LAB — POC URINE PREG, ED: Preg Test, Ur: NEGATIVE

## 2016-02-25 MED ORDER — SODIUM CHLORIDE 0.9 % IV BOLUS (SEPSIS)
1000.0000 mL | Freq: Once | INTRAVENOUS | Status: AC
  Administered 2016-02-26: 1000 mL via INTRAVENOUS

## 2016-02-25 MED ORDER — POTASSIUM CHLORIDE CRYS ER 20 MEQ PO TBCR
60.0000 meq | EXTENDED_RELEASE_TABLET | Freq: Once | ORAL | Status: AC
  Administered 2016-02-26: 60 meq via ORAL
  Filled 2016-02-25: qty 3

## 2016-02-25 MED ORDER — NICARDIPINE HCL IN NACL 20-0.86 MG/200ML-% IV SOLN
5.0000 mg/h | Freq: Once | INTRAVENOUS | Status: AC
  Administered 2016-02-26: 5 mg/h via INTRAVENOUS
  Filled 2016-02-25: qty 200

## 2016-02-25 MED ORDER — METOCLOPRAMIDE HCL 5 MG/ML IJ SOLN
10.0000 mg | Freq: Once | INTRAMUSCULAR | Status: AC
  Administered 2016-02-26: 10 mg via INTRAVENOUS
  Filled 2016-02-25: qty 2

## 2016-02-25 MED ORDER — DIPHENHYDRAMINE HCL 50 MG/ML IJ SOLN
25.0000 mg | Freq: Once | INTRAMUSCULAR | Status: AC
  Administered 2016-02-26: 25 mg via INTRAVENOUS
  Filled 2016-02-25: qty 1

## 2016-02-25 NOTE — ED Provider Notes (Signed)
CSN: [id number]     Arrival date & time 02/25/16  2237 History  By signing my name below, I, Emmanuella Mensah, attest that this documentation has been prepared under the direction and in the presence of Tomasita Crumble, MD. Electronically Signed: Angelene Giovanni, ED Scribe. 02/25/2016. 11:18 PM.   Chief Complaint  Patient presents with  . Hypertension   The history is provided by the patient. No language interpreter was used.   HPI Comments: Raven Sawyer is a 60 y.o. female with a hx of hypertension who presents to the Emergency Department complaining of gradually worsening back pain and generalized HA onset 2 hours ago. Pt presents with HTN and states that her blood pressure is normally high when she is sick. She reports associated nausea and left arm pain. She states that she has been out of her HTN medication (Hydrochlorothiazide) for 6 months. No alleviating factors noted. Pt has not tried any medications PTA. She denies any vomiting. No CP, SOB, urinary symptoms, or bowel/bladder incontinence.    Past Medical History  Diagnosis Date  . Hypertension    History reviewed. No pertinent past surgical history. History reviewed. No pertinent family history. Social History  Substance Use Topics  . Smoking status: Current Every Day Smoker -- 1.00 packs/day    Types: Cigarettes  . Smokeless tobacco: None  . Alcohol Use: No   OB History    No data available     Review of Systems  Cardiovascular: Negative for chest pain.  Gastrointestinal: Positive for nausea. Negative for vomiting.  Genitourinary: Negative for dysuria and hematuria.  Musculoskeletal: Positive for arthralgias.  Neurological: Positive for headaches.  All other systems reviewed and are negative.     Allergies  Review of patient's allergies indicates no known allergies.  Home Medications   Prior to Admission medications   Medication Sig Start Date End Date Taking? Authorizing Provider  hydrochlorothiazide  (HYDRODIURIL) 25 MG tablet Take 1 tablet (25 mg total) by mouth daily. 10/14/12   Elson Areas, PA-C  promethazine (PHENERGAN) 25 MG suppository Place 1 suppository (25 mg total) rectally every 6 (six) hours as needed for nausea. 10/22/12   Gilda Crease, MD  promethazine (PHENERGAN) 25 MG tablet Take 1 tablet (25 mg total) by mouth every 6 (six) hours as needed for nausea. 10/20/12   Earley Favor, NP   BP 207/118 mmHg  Pulse 77  Temp(Src) 97.9 F (36.6 C) (Oral)  Resp 20  Ht 5\' 4"  (1.626 m)  Wt 127 lb (57.607 kg)  BMI 21.79 kg/m2  SpO2 96% Physical Exam  Constitutional: She is oriented to person, place, and time. No distress.  Appears weak  HENT:  Head: Normocephalic and atraumatic.  Nose: Nose normal.  Mouth/Throat: Oropharynx is clear and moist. No oropharyngeal exudate.  Eyes: Conjunctivae and EOM are normal. Pupils are equal, round, and reactive to light. No scleral icterus.  Neck: Normal range of motion. Neck supple. No JVD present. No tracheal deviation present. No thyromegaly present.  Cardiovascular: Normal rate, regular rhythm and normal heart sounds.  Exam reveals no gallop and no friction rub.   No murmur heard. Pulmonary/Chest: Effort normal and breath sounds normal. No respiratory distress. She has no wheezes. She exhibits no tenderness.  Abdominal: Soft. Bowel sounds are normal. She exhibits no distension and no mass. There is no tenderness. There is no rebound and no guarding.  Musculoskeletal: Normal range of motion. She exhibits no edema or tenderness.  Lymphadenopathy:    She has  no cervical adenopathy.  Neurological: She is alert and oriented to person, place, and time. No cranial nerve deficit. She exhibits normal muscle tone.  Normal strength and sensation in all extremities   Skin: Skin is warm and dry. No rash noted. No erythema. No pallor.  Nursing note and vitals reviewed.   ED Course  Procedures (including critical care time) DIAGNOSTIC  STUDIES: Oxygen Saturation is 96% on RA, normal by my interpretation.    COORDINATION OF CARE: 11:06 PM- Pt advised of plan for treatment and pt agrees. Pt will receive head CT and lab work for further evaluation.    Labs Review Labs Reviewed  CBC - Abnormal; Notable for the following:    WBC 14.6 (*)    Platelets 407 (*)    All other components within normal limits  DIFFERENTIAL - Abnormal; Notable for the following:    Neutro Abs 11.0 (*)    All other components within normal limits  COMPREHENSIVE METABOLIC PANEL - Abnormal; Notable for the following:    Potassium 3.1 (*)    Glucose, Bld 115 (*)    Total Protein 8.4 (*)    All other components within normal limits  URINALYSIS, ROUTINE W REFLEX MICROSCOPIC (NOT AT [facility]) - Abnormal; Notable for the following:    APPearance TURBID (*)    pH 8.5 (*)    Ketones, ur 15 (*)    All other components within normal limits  URINE MICROSCOPIC-ADD ON - Abnormal; Notable for the following:    Squamous Epithelial / LPF 0-5 (*)    Bacteria, UA FEW (*)    All other components within normal limits  I-STAT CHEM 8, ED - Abnormal; Notable for the following:    Potassium 3.0 (*)    Glucose, Bld 115 (*)    All other components within normal limits  ETHANOL  PROTIME-INR  APTT  URINE RAPID DRUG SCREEN, HOSP PERFORMED  POC URINE PREG, ED    Imaging Review Ct Head Wo Contrast  02/26/2016  CLINICAL DATA:  60 year old female with elevated blood pressure and nausea. Severe headache and back pain. EXAM: CT HEAD WITHOUT CONTRAST TECHNIQUE: Contiguous axial images were obtained from the base of the skull through the vertex without intravenous contrast. COMPARISON:  None. FINDINGS: The ventricles and sulci are appropriate in size for patient's age. Minimal periventricular and deep white matter chronic microvascular ischemic changes noted. There is no acute intracranial hemorrhage. No mass effect or midline shift. The visualized paranasal sinuses and mastoid  air cells are clear. The calvarium is intact. IMPRESSION: No acute intracranial pathology. Electronically Signed   By: Elgie Collard M.D.   On: 02/26/2016 00:57     Tomasita Crumble, MD has personally reviewed and evaluated these images and lab results as part of his medical decision-making.   EKG Interpretation   Date/Time:  Monday February 25 2016 22:40:42 EDT Ventricular Rate:  71 PR Interval:  146 QRS Duration: 88 QT Interval:  452 QTC Calculation: 491 R Axis:   60 Text Interpretation:  Normal sinus rhythm Moderate voltage criteria for  LVH, may be normal variant Nonspecific T wave abnormality Abnormal ECG No  significant change since last tracing Confirmed by Erroll Luna  [phone]) on 02/25/2016 11:01:09 PM      MDM   Final diagnoses:  None   Patient presents to the ED for headache, nausea with sudden onset.  States her BP normally does not go over 200.  I have concern for hypertensive emergency.  Stat CT  head was obtained to assess for bleed.  She was placed on nicardipine drip and given reglan/benadryl for headache.     Patient continues to have HA but neuro symptoms have improved with lowered BP.  CT head neg for bleed.  I spoke with Dr. Tyson AliasFeinstein who accepts patient for admission.   CRITICAL CARE Performed by: Tomasita CrumbleNI,Bransen Fassnacht   Total critical care time: 45 minutes - hypertensive emergency requiring IV nicardipine  Critical care time was exclusive of separately billable procedures and treating other patients.  Critical care was necessary to treat or prevent imminent or life-threatening deterioration.  Critical care was time spent personally by me on the following activities: development of treatment plan with patient and/or surrogate as well as nursing, discussions with consultants, evaluation of patient's response to treatment, examination of patient, obtaining history from patient or surrogate, ordering and performing treatments and interventions, ordering and review  of laboratory studies, ordering and review of radiographic studies, pulse oximetry and re-evaluation of patient's condition.    I personally performed the services described in this documentation, which was scribed in my presence. The recorded information has been reviewed and is accurate.     Tomasita CrumbleAdeleke Blayn Whetsell, MD 02/26/16 1407

## 2016-02-25 NOTE — ED Notes (Signed)
Pt brought to ED by [facility] from home for HTN for six months wo medication gotten worse today, BP 220/110 on EMS arrival, pt c/o 10/10 HA, 4 mg Zofran IV given by EMS PTA to ED. BP 183/119, HR 70, R 20, SPO2 96% on RA.

## 2016-02-26 ENCOUNTER — Inpatient Hospital Stay (HOSPITAL_COMMUNITY): Payer: MEDICAID

## 2016-02-26 ENCOUNTER — Inpatient Hospital Stay (HOSPITAL_COMMUNITY): Payer: Self-pay

## 2016-02-26 ENCOUNTER — Emergency Department (HOSPITAL_COMMUNITY): Payer: Self-pay

## 2016-02-26 DIAGNOSIS — I161 Hypertensive emergency: Secondary | ICD-10-CM | POA: Diagnosis present

## 2016-02-26 DIAGNOSIS — G44209 Tension-type headache, unspecified, not intractable: Secondary | ICD-10-CM

## 2016-02-26 DIAGNOSIS — R079 Chest pain, unspecified: Secondary | ICD-10-CM

## 2016-02-26 LAB — COMPREHENSIVE METABOLIC PANEL
ALT: 19 U/L (ref 14–54)
AST: 26 U/L (ref 15–41)
Albumin: 4.3 g/dL (ref 3.5–5.0)
Alkaline Phosphatase: 61 U/L (ref 38–126)
Anion gap: 10 (ref 5–15)
BILIRUBIN TOTAL: 0.6 mg/dL (ref 0.3–1.2)
BUN: 10 mg/dL (ref 6–20)
CO2: 25 mmol/L (ref 22–32)
Calcium: 9.9 mg/dL (ref 8.9–10.3)
Chloride: 103 mmol/L (ref 101–111)
Creatinine, Ser: 0.75 mg/dL (ref 0.44–1.00)
Glucose, Bld: 115 mg/dL — ABNORMAL HIGH (ref 65–99)
POTASSIUM: 3.1 mmol/L — AB (ref 3.5–5.1)
Sodium: 138 mmol/L (ref 135–145)
TOTAL PROTEIN: 8.4 g/dL — AB (ref 6.5–8.1)

## 2016-02-26 LAB — RAPID URINE DRUG SCREEN, HOSP PERFORMED
AMPHETAMINES: NOT DETECTED
BENZODIAZEPINES: NOT DETECTED
Barbiturates: NOT DETECTED
COCAINE: POSITIVE — AB
OPIATES: POSITIVE — AB
Tetrahydrocannabinol: POSITIVE — AB

## 2016-02-26 LAB — TSH: TSH: 0.36 u[IU]/mL (ref 0.350–4.500)

## 2016-02-26 LAB — BASIC METABOLIC PANEL
ANION GAP: 8 (ref 5–15)
BUN: 6 mg/dL (ref 6–20)
CHLORIDE: 111 mmol/L (ref 101–111)
CO2: 22 mmol/L (ref 22–32)
Calcium: 8.9 mg/dL (ref 8.9–10.3)
Creatinine, Ser: 0.63 mg/dL (ref 0.44–1.00)
GFR calc Af Amer: >60 mL/min (ref >60)
GLUCOSE: 147 mg/dL — AB (ref 65–99)
POTASSIUM: 3.1 mmol/L — AB (ref 3.5–5.1)
Sodium: 141 mmol/L (ref 135–145)

## 2016-02-26 LAB — CBC
HEMATOCRIT: 38.7 % (ref 36.0–46.0)
HEMATOCRIT: 40.8 % (ref 36.0–46.0)
HEMOGLOBIN: 12.5 g/dL (ref 12.0–15.0)
HEMOGLOBIN: 13.3 g/dL (ref 12.0–15.0)
MCH: 25.9 pg — AB (ref 26.0–34.0)
MCH: 26.6 pg (ref 26.0–34.0)
MCHC: 32.3 g/dL (ref 30.0–36.0)
MCHC: 32.6 g/dL (ref 30.0–36.0)
MCV: 80.1 fL (ref 78.0–100.0)
MCV: 81.6 fL (ref 78.0–100.0)
PLATELETS: 342 10*3/uL (ref 150–400)
Platelets: 407 10*3/uL — ABNORMAL HIGH (ref 150–400)
RBC: 4.83 MIL/uL (ref 3.87–5.11)
RBC: 5 MIL/uL (ref 3.87–5.11)
RDW: 13.5 % (ref 11.5–15.5)
RDW: 13.6 % (ref 11.5–15.5)
WBC: 14.6 10*3/uL — ABNORMAL HIGH (ref 4.0–10.5)
WBC: 7.6 10*3/uL (ref 4.0–10.5)

## 2016-02-26 LAB — URINE MICROSCOPIC-ADD ON

## 2016-02-26 LAB — URINALYSIS, ROUTINE W REFLEX MICROSCOPIC
BILIRUBIN URINE: NEGATIVE
Glucose, UA: NEGATIVE mg/dL
Hgb urine dipstick: NEGATIVE
Ketones, ur: 15 mg/dL — AB
Leukocytes, UA: NEGATIVE
NITRITE: NEGATIVE
PH: 8.5 — AB (ref 5.0–8.0)
Protein, ur: NEGATIVE mg/dL
SPECIFIC GRAVITY, URINE: 1.017 (ref 1.005–1.030)

## 2016-02-26 LAB — GLUCOSE, CAPILLARY
GLUCOSE-CAPILLARY: 153 mg/dL — AB (ref 65–99)
GLUCOSE-CAPILLARY: 127 mg/dL — AB (ref 65–99)
GLUCOSE-CAPILLARY: 128 mg/dL — AB (ref 65–99)
GLUCOSE-CAPILLARY: 114 mg/dL — AB (ref 65–99)
Glucose-Capillary: 141 mg/dL — ABNORMAL HIGH (ref 65–99)

## 2016-02-26 LAB — ECHOCARDIOGRAM COMPLETE
HEIGHTINCHES: 64 in
Weight: 1982.38 oz

## 2016-02-26 LAB — PROTIME-INR
INR: 1.02 (ref 0.00–1.49)
PROTHROMBIN TIME: 13.6 s (ref 11.6–15.2)

## 2016-02-26 LAB — DIFFERENTIAL
BASOS ABS: 0 10*3/uL (ref 0.0–0.1)
Basophils Relative: 0 %
EOS ABS: 0.1 10*3/uL (ref 0.0–0.7)
Eosinophils Relative: 1 %
LYMPHS ABS: 2.6 10*3/uL (ref 0.7–4.0)
Lymphocytes Relative: 18 %
MONO ABS: 0.8 10*3/uL (ref 0.1–1.0)
MONOS PCT: 5 %
Neutro Abs: 11 10*3/uL — ABNORMAL HIGH (ref 1.7–7.7)
Neutrophils Relative %: 76 %

## 2016-02-26 LAB — MRSA PCR SCREENING: MRSA BY PCR: NEGATIVE

## 2016-02-26 LAB — MAGNESIUM: Magnesium: 1.8 mg/dL (ref 1.7–2.4)

## 2016-02-26 LAB — TROPONIN I
Troponin I: <0.03 ng/mL (ref <0.031)
Troponin I: <0.03 ng/mL (ref <0.031)
Troponin I: <0.03 ng/mL (ref <0.031)

## 2016-02-26 LAB — ETHANOL

## 2016-02-26 LAB — APTT: aPTT: 32 seconds (ref 24–37)

## 2016-02-26 LAB — PHOSPHORUS: Phosphorus: 2.4 mg/dL — ABNORMAL LOW (ref 2.5–4.6)

## 2016-02-26 MED ORDER — SODIUM CHLORIDE 0.9 % IV SOLN
250.0000 mL | INTRAVENOUS | Status: DC | PRN
Start: 2016-02-26 — End: 2016-02-28

## 2016-02-26 MED ORDER — KETOROLAC TROMETHAMINE 30 MG/ML IJ SOLN
30.0000 mg | Freq: Once | INTRAMUSCULAR | Status: AC
Start: 2016-02-26 — End: 2016-02-26
  Administered 2016-02-26: 30 mg via INTRAVENOUS
  Filled 2016-02-26: qty 1

## 2016-02-26 MED ORDER — INSULIN ASPART 100 UNIT/ML ~~LOC~~ SOLN
0.0000 [IU] | SUBCUTANEOUS | Status: DC
  Administered 2016-02-26 (×3): 1 [IU] via SUBCUTANEOUS
  Administered 2016-02-26 – 2016-02-27 (×2): 2 [IU] via SUBCUTANEOUS
  Administered 2016-02-27: 1 [IU] via SUBCUTANEOUS
  Administered 2016-02-27: 2 [IU] via SUBCUTANEOUS

## 2016-02-26 MED ORDER — NICARDIPINE HCL IN NACL 20-0.86 MG/200ML-% IV SOLN
3.0000 mg/h | INTRAVENOUS | Status: DC
  Administered 2016-02-26: 3 mg/h via INTRAVENOUS
  Administered 2016-02-26: 4 mg/h via INTRAVENOUS
  Administered 2016-02-26: 5 mg/h via INTRAVENOUS
  Filled 2016-02-26 (×3): qty 200

## 2016-02-26 MED ORDER — AMLODIPINE BESYLATE 5 MG PO TABS
5.0000 mg | ORAL_TABLET | Freq: Every day | ORAL | Status: DC
  Administered 2016-02-26: 5 mg via ORAL
  Filled 2016-02-26 (×2): qty 1

## 2016-02-26 MED ORDER — SODIUM CHLORIDE 0.9% FLUSH
3.0000 mL | Freq: Two times a day (BID) | INTRAVENOUS | Status: DC
  Administered 2016-02-26 – 2016-02-28 (×5): 3 mL via INTRAVENOUS

## 2016-02-26 MED ORDER — SODIUM CHLORIDE 0.9% FLUSH
3.0000 mL | INTRAVENOUS | Status: DC | PRN
  Administered 2016-02-26: 10 mL via INTRAVENOUS
  Administered 2016-02-26: 3 mL via INTRAVENOUS
  Filled 2016-02-26 (×2): qty 3

## 2016-02-26 MED ORDER — HYDRALAZINE HCL 20 MG/ML IJ SOLN
5.0000 mg | INTRAMUSCULAR | Status: DC | PRN
  Administered 2016-02-26 – 2016-02-28 (×2): 5 mg via INTRAVENOUS
  Filled 2016-02-26 (×3): qty 1

## 2016-02-26 MED ORDER — ACETAMINOPHEN 325 MG PO TABS
650.0000 mg | ORAL_TABLET | Freq: Four times a day (QID) | ORAL | Status: DC | PRN
  Administered 2016-02-26 – 2016-02-28 (×5): 650 mg via ORAL
  Filled 2016-02-26 (×6): qty 2

## 2016-02-26 MED ORDER — ASPIRIN 325 MG PO TABS
325.0000 mg | ORAL_TABLET | Freq: Every day | ORAL | Status: DC
  Administered 2016-02-26 – 2016-02-28 (×3): 325 mg via ORAL
  Filled 2016-02-26 (×3): qty 1

## 2016-02-26 MED ORDER — SODIUM CHLORIDE 0.9 % IV SOLN: 250.0000 mL | INTRAVENOUS | Status: DC | PRN

## 2016-02-26 MED ORDER — FAMOTIDINE IN NACL 20-0.9 MG/50ML-% IV SOLN
20.0000 mg | Freq: Two times a day (BID) | INTRAVENOUS | Status: DC
  Administered 2016-02-26 (×2): 20 mg via INTRAVENOUS
  Filled 2016-02-26 (×4): qty 50

## 2016-02-26 MED ORDER — LORAZEPAM 0.5 MG PO TABS
0.5000 mg | ORAL_TABLET | Freq: Two times a day (BID) | ORAL | Status: DC
  Administered 2016-02-26 – 2016-02-28 (×5): 0.5 mg via ORAL
  Filled 2016-02-26 (×6): qty 1

## 2016-02-26 MED ORDER — K PHOS MONO-SOD PHOS DI & MONO 155-852-130 MG PO TABS
250.0000 mg | ORAL_TABLET | Freq: Once | ORAL | Status: AC
  Administered 2016-02-26: 250 mg via ORAL
  Filled 2016-02-26: qty 1

## 2016-02-26 MED ORDER — HEPARIN SODIUM (PORCINE) 5000 UNIT/ML IJ SOLN
5000.0000 [IU] | Freq: Three times a day (TID) | INTRAMUSCULAR | Status: DC
  Administered 2016-02-26 – 2016-02-28 (×6): 5000 [IU] via SUBCUTANEOUS
  Filled 2016-02-26 (×9): qty 1

## 2016-02-26 NOTE — Care Management Note (Signed)
Case Management Note  Patient Details  Name: Maxine GlennBrenda C Dunstan MRN: [id number] Date of Birth: 30-Jun-1956  Subjective/Objective:   Pt admitted with hypertensive crisi                 Action/Plan:  Pt is from home with husband, PTA pt was independent.  Pt states her doctor retired and has not been able to get new BP medications prescribed and therefore has been without needed meds for over 6 months.  [facility] and medication assistance.   Pt agreed to get CM [facility] - discharge date not yet determined.  Pt on Cardene drip.  CM will continue to follow and set up appt prior to discharge - may also require MATCH letter .    CSW consulted for substance abuse,  CM will continue to monitor.   Expected Discharge Date:                  Expected Discharge Plan:  Home/Self Care  In-House Referral:     Discharge planning Services  CM Consult  Post Acute Care Choice:    Choice offered to:     DME Arranged:    DME Agency:     HH Arranged:    HH Agency:     Status of Service:  In process, will continue to follow  Medicare Important Message Given:    Date Medicare IM Given:    Medicare IM give by:    Date Additional Medicare IM Given:    Additional Medicare Important Message give by:     If discussed at Long Length of Stay Meetings, dates discussed:    Additional Comments:  Cherylann ParrClaxton, Panzy Bubeck S, RN 02/26/2016, 1:32 PM

## 2016-02-26 NOTE — H&P (Signed)
PULMONARY / CRITICAL CARE MEDICINE   Name: Raven GlennBrenda C Partington MRN: [id number] DOB: 1956/01/21    ADMISSION DATE:  02/25/2016 CONSULTATION DATE:  02/25/16   REFERRING MD:  Dr Mora Bellmanni, EDP  CHIEF COMPLAINT:  HTN crisis, tingling in hands, HTN  HISTORY OF PRESENT ILLNESS:  60 yr old female, known h/o HTN and noncompliance last 6 months with meds.  Pt brought to ED by [facility] from home for HTN for six months wo medication gotten worse today, BP 220/110 on EMS arrival, pt c/o 10/10 HA, 4 mg Zofran IV given by EMS PTA to ED. He also described tingling in bilateral hands. No back pain, no CP, no SOB. Treated nicardipine with improvement in BP and hand tingling eventually. CT head performed negative bleed. Called to admit the pt to hospital secondary to vaso active meds required.  The etiology for noncomplainace was related to her changing MD as primary.    PAST MEDICAL HISTORY :  She  has a past medical history of Hypertension.  PAST SURGICAL HISTORY: She  has no past surgical history on file.  No Known Allergies  No current facility-administered medications on file prior to encounter.   Current Outpatient Prescriptions on File Prior to Encounter  Medication Sig  . hydrochlorothiazide (HYDRODIURIL) 25 MG tablet Take 1 tablet (25 mg total) by mouth daily.  . promethazine (PHENERGAN) 25 MG suppository Place 1 suppository (25 mg total) rectally every 6 (six) hours as needed for nausea.  . promethazine (PHENERGAN) 25 MG tablet Take 1 tablet (25 mg total) by mouth every 6 (six) hours as needed for nausea.    FAMILY HISTORY:  Her has no family status information on file.   SOCIAL HISTORY: She  reports that she has been smoking Cigarettes.  She has been smoking about 1.00 pack per day. She does not have any smokeless tobacco history on file. She reports that she does not drink alcohol or use illicit drugs.  REVIEW OF SYSTEMS:   Review of Systems  Constitutional: Negative for fever, chills, weight loss  and malaise/fatigue.  HENT: Negative for congestion, ear discharge, ear pain, hearing loss, nosebleeds, sore throat and tinnitus.   Eyes: Negative for blurred vision, double vision, photophobia and pain.  Respiratory: Negative for cough, hemoptysis and stridor.   Cardiovascular: Negative for chest pain, palpitations, orthopnea, claudication and leg swelling.  Gastrointestinal: Negative for heartburn, nausea, vomiting, abdominal pain, diarrhea and constipation.  Genitourinary: Negative for urgency and frequency.  Musculoskeletal: Positive for neck pain. Negative for myalgias and back pain.  Skin: Negative for itching and rash.  Neurological: Positive for tingling, sensory change and headaches. Negative for tremors, speech change, focal weakness, seizures and loss of consciousness.  Psychiatric/Behavioral: Negative for suicidal ideas.     SUBJECTIVE:  Headache resolved  VITAL SIGNS: BP 148/95 mmHg  Pulse 106  Temp(Src) 97.9 F (36.6 C) (Oral)  Resp 15  Ht 5\' 4"  (1.626 m)  Wt 57.607 kg (127 lb)  BMI 21.79 kg/m2  SpO2 99%  HEMODYNAMICS:    VENTILATOR SETTINGS:    INTAKE / OUTPUT:    PHYSICAL EXAMINATION: General:  Resting comfortably Neuro:  Awake and alert, oriented x4, strength 5/5 in all four extremities, CN II-XII intact HEENT:  NCAT OP clear Cardiovascular:  RRR, no mgr Lungs:  CTA B, normal effort Abdomen:  Belly soft, nontender Musculoskeletal:  Normal bulk and tone Skin:  No rash or skin breakdown  LABS:  BMET  Recent Labs Lab 02/25/16 2337 02/25/16 2351  NA  140 138  K 3.0* 3.1*  CL 101 103  CO2  --  25  BUN 9 10  CREATININE 0.70 0.75  GLUCOSE 115* 115*    Electrolytes  Recent Labs Lab 02/25/16 2351  CALCIUM 9.9    CBC  Recent Labs Lab 02/25/16 2337 02/25/16 2351  WBC  --  14.6*  HGB 15.0 13.3  HCT 44.0 40.8  PLT  --  407*    Coag's  Recent Labs Lab 02/25/16 2351  APTT 32  INR 1.02    Sepsis Markers No results for  input(s): LATICACIDVEN, PROCALCITON, O2SATVEN in the last 168 hours.  ABG No results for input(s): PHART, PCO2ART, PO2ART in the last 168 hours.  Liver Enzymes  Recent Labs Lab 02/25/16 2351  AST 26  ALT 19  ALKPHOS 61  BILITOT 0.6  ALBUMIN 4.3    Cardiac Enzymes No results for input(s): TROPONINI, PROBNP in the last 168 hours.  Glucose No results for input(s): GLUCAP in the last 168 hours.  Imaging Ct Head Wo Contrast  02/26/2016  CLINICAL DATA:  60 year old female with elevated blood pressure and nausea. Severe headache and back pain. EXAM: CT HEAD WITHOUT CONTRAST TECHNIQUE: Contiguous axial images were obtained from the base of the skull through the vertex without intravenous contrast. COMPARISON:  None. FINDINGS: The ventricles and sulci are appropriate in size for patient's age. Minimal periventricular and deep white matter chronic microvascular ischemic changes noted. There is no acute intracranial hemorrhage. No mass effect or midline shift. The visualized paranasal sinuses and mastoid air cells are clear. The calvarium is intact. IMPRESSION: No acute intracranial pathology. Electronically Signed   By: Elgie Collard M.D.   On: 02/26/2016 00:57     STUDIES:  6/5 CT head>>>neg   CULTURES:   ANTIBIOTICS:   SIGNIFICANT EVENTS: 6 months- Not taking hTn meds 6/5- 210/120, nicardipine, ct head neg  LINES/TUBES:   ASSESSMENT / PLAN:  PULMONARY A: HTN crisis, r/o pulm edema P:   pcxr sats assessment No role ABG  CARDIOVASCULAR A:  HTn emergency, r/o cardiomyoapthy P:  Nicardipine to map 25% reduction from highest MAP on admission - range = 100-115 kvo Echo Tele Trop x 1 Oral meds in am   RENAL A:   Hypokalemia P:   Repeat bmet Assess am phos, mag  GASTROINTESTINAL A:   HTN  P:   Low Na diet when started PPI for now  HEMATOLOGIC A:   DVT prevention P:  Sub q heparin CBC in am   INFECTIOUS A:   Afebrile P:   Monitor temp  curve  ENDOCRINE A:   At risk hyperglycemia P:   SSI TSH in am   NEUROLOGIC A:   HTN Emergency R/o TIA Resolved Headache P:   RASS goal: 0 Monitor neurchecks ASA indicated, no blood on CT Echo   FAMILY  - Updates: to pt directly from Dr Kendrick Fries - Inter-disciplinary family meet or Palliative Care meeting due by: 6/13  My cc time 35 minutes  Heber Jolivue, MD [facility] Pager: [phone] Cell: [phone] After 3pm or if no response, call [phone]   02/26/2016, 2:44 AM

## 2016-02-26 NOTE — Progress Notes (Signed)
Echocardiogram 2D Echocardiogram has been performed.  Raven Sawyer, [name] 02/26/2016, 10:18 AM

## 2016-02-26 NOTE — H&P (Signed)
PULMONARY / CRITICAL CARE MEDICINE   Name: Raven Sawyer MRN: [id number] DOB: Jan 14, 1956    ADMISSION DATE:  02/25/2016 CONSULTATION DATE:  02/25/16   REFERRING MD:  Dr Mora Bellmanni, EDP  CHIEF COMPLAINT:  HTN crisis, tingling in hands, HTN  HISTORY OF PRESENT ILLNESS:  60 yr old female, known h/o HTN and noncompliance last 6 months with meds.  Pt brought to ED by [facility] from home for HTN for six months wo medication gotten worse today, BP 220/110 on EMS arrival, pt c/o 10/10 HA, 4 mg Zofran IV given by EMS PTA to ED. He also described tingling in bilateral hands. No back pain, no CP, no SOB. Treated nicardipine with improvement in BP and hand tingling eventually. CT head performed negative bleed. Called to admit the pt to hospital secondary to vaso active meds required.  The etiology for noncomplainace was related to her changing MD as primary.     REVIEW OF SYSTEMS:   Review of Systems  Constitutional: Negative for fever, chills, weight loss and malaise/fatigue.  HENT: Negative for congestion, ear discharge, ear pain, hearing loss, nosebleeds, sore throat and tinnitus.   Eyes: Negative for blurred vision, double vision, photophobia and pain.  Respiratory: Negative for cough, hemoptysis and stridor.   Cardiovascular: Negative for chest pain, palpitations, orthopnea, claudication and leg swelling.  Gastrointestinal: Negative for heartburn, nausea, vomiting, abdominal pain, diarrhea and constipation.  Genitourinary: Negative for urgency and frequency.  Musculoskeletal: Positive for neck pain. Negative for myalgias and back pain.  Skin: Negative for itching and rash.  Neurological: Positive for tingling, sensory change and headaches. Negative for tremors, speech change, focal weakness, seizures and loss of consciousness.  Psychiatric/Behavioral: Negative for suicidal ideas.     SUBJECTIVE:  Headache resolved, but continues to complain of neck pain, is requesting pain medicine. " My blood pressure  will go up if you don't give me something for pain".  VITAL SIGNS: BP 141/92 mmHg  Pulse 97  Temp(Src) 98.7 F (37.1 C) (Oral)  Resp 22  Ht 5\' 4"  (1.626 m)  Wt 123 lb 14.4 oz (56.2 kg)  BMI 21.26 kg/m2  SpO2 98%  HEMODYNAMICS:    VENTILATOR SETTINGS:    INTAKE / OUTPUT: I/O last 3 completed shifts: In: 382.5 [I.V.:382.5] Out: -   PHYSICAL EXAMINATION: General: Agitated at present, tachypnic, ? With drawl Neuro:  Awake and alert, oriented x4, strength 5/5 in all four extremities, CN II-XII intact HEENT:  NCAT OP clear Cardiovascular:  RRR, no mgr Lungs:  CTA B, slightly tachypnic Abdomen:  Belly soft, nontender Musculoskeletal:  Normal bulk and tone Skin:  No rash or skin breakdown  LABS:  BMET  Recent Labs Lab 02/25/16 2337 02/25/16 2351 02/26/16 0426  NA 140 138 141  K 3.0* 3.1* 3.1*  CL 101 103 111  CO2  --  25 22  BUN 9 10 6   CREATININE 0.70 0.75 0.63  GLUCOSE 115* 115* 147*    Electrolytes  Recent Labs Lab 02/25/16 2351 02/26/16 0426  CALCIUM 9.9 8.9  MG  --  1.8  PHOS  --  2.4*    CBC  Recent Labs Lab 02/25/16 2337 02/25/16 2351 02/26/16 0426  WBC  --  14.6* 7.6  HGB 15.0 13.3 12.5  HCT 44.0 40.8 38.7  PLT  --  407* 342    Coag's  Recent Labs Lab 02/25/16 2351  APTT 32  INR 1.02    Sepsis Markers No results for input(s): LATICACIDVEN, PROCALCITON, O2SATVEN in the  last 168 hours.  ABG No results for input(s): PHART, PCO2ART, PO2ART in the last 168 hours.  Liver Enzymes  Recent Labs Lab 02/25/16 2351  AST 26  ALT 19  ALKPHOS 61  BILITOT 0.6  ALBUMIN 4.3    Cardiac Enzymes  Recent Labs Lab 02/26/16 0426  TROPONINI <0.03    Glucose  Recent Labs Lab 02/26/16 0412 02/26/16 0832  GLUCAP 153* 127*    Imaging Ct Head Wo Contrast  02/26/2016  CLINICAL DATA:  60 year old female with elevated blood pressure and nausea. Severe headache and back pain. EXAM: CT HEAD WITHOUT CONTRAST TECHNIQUE: Contiguous  axial images were obtained from the base of the skull through the vertex without intravenous contrast. COMPARISON:  None. FINDINGS: The ventricles and sulci are appropriate in size for patient's age. Minimal periventricular and deep white matter chronic microvascular ischemic changes noted. There is no acute intracranial hemorrhage. No mass effect or midline shift. The visualized paranasal sinuses and mastoid air cells are clear. The calvarium is intact. IMPRESSION: No acute intracranial pathology. Electronically Signed   By: Elgie Collard M.D.   On: 02/26/2016 00:57   Dg Chest Port 1 View  02/26/2016  CLINICAL DATA:  Assess edema.  Hypertensive emergency. EXAM: PORTABLE CHEST 1 VIEW COMPARISON:  10/22/2012 FINDINGS: Normal heart size for technique. Normal aortic and hilar contours. Mild interstitial coarsening correlating with history of edema in this patient with hypertensive emergency. No effusion or pneumothorax. IMPRESSION: Mild interstitial edema. Electronically Signed   By: Marnee Spring M.D.   On: 02/26/2016 02:54     STUDIES:  6/5 CT head>>>neg  6/6 CXR>>Mild interstitial edema.  CULTURES:   Toxicology: 6/5>>Positive for Opiates, Cocaine and Tetrahydrocannabinol   ANTIBIOTICS:   SIGNIFICANT EVENTS: 6 months- Not taking hTn meds 6/5- 210/120, nicardipine, ct head neg  LINES/TUBES: PIV x 1  ASSESSMENT / PLAN:  PULMONARY A: HTN crisis, r/o pulm edema CXR shows mild interstitial edema Auto diuresing, -300 since midnight. P:   pcxr in am ( 6/7) sats assessment Low thresh hold for Lasix if decompensates.  CARDIOVASCULAR A:  HTn emergency, r/o cardiomyoapthy Troponin < 0.03 P:  Nicardipine to map 25% reduction from highest MAP on admission - range = 100-115 kvo Echo pending Amlodipine 5 mg started 6/6/am Hydralazine  5 mg Started as prn for SBP of 160 Wean Cardene gtt to off Case Management for assist with home meds Needs PCP consult at discharge as not being  followed   RENAL A:   Hypokalemia Low Phos P:   Repeat bmet Replete Phos 6/6/am   GASTROINTESTINAL A:   HTN  P:   Low Na diet  PPI for now  HEMATOLOGIC A:   DVT prevention P:  Sub q heparin CBC in am   INFECTIOUS A:   Afebrile P:   Monitor temp curve/ WBC   ENDOCRINE A:   At risk hyperglycemia TSH 0.360 P:   SSI    NEUROLOGIC A:   HTN Emergency R/o TIA Resolved Headache P:   RASS goal: 0 Monitor neurchecks ASA indicated, no blood on CT Echo pending   FAMILY  - Updates: to pt directly from NP, no family at bedside - Inter-disciplinary family meet or Palliative Care meeting due by: 6/13  Bevelyn Ngo, AGACNP-BC [facility] [phone] 02/26/2016, 8:47 AM

## 2016-02-27 LAB — CBC
HCT: 40.3 % (ref 36.0–46.0)
Hemoglobin: 13.1 g/dL (ref 12.0–15.0)
MCH: 25.8 pg — ABNORMAL LOW (ref 26.0–34.0)
MCHC: 32.5 g/dL (ref 30.0–36.0)
MCV: 79.3 fL (ref 78.0–100.0)
Platelets: 412 10*3/uL — ABNORMAL HIGH (ref 150–400)
RBC: 5.08 MIL/uL (ref 3.87–5.11)
RDW: 13.7 % (ref 11.5–15.5)
WBC: 9.4 10*3/uL (ref 4.0–10.5)

## 2016-02-27 LAB — BASIC METABOLIC PANEL
ANION GAP: 12 (ref 5–15)
BUN: 7 mg/dL (ref 6–20)
CHLORIDE: 103 mmol/L (ref 101–111)
CO2: 23 mmol/L (ref 22–32)
CREATININE: 0.64 mg/dL (ref 0.44–1.00)
Calcium: 10 mg/dL (ref 8.9–10.3)
GFR calc non Af Amer: >60 mL/min (ref >60)
Glucose, Bld: 121 mg/dL — ABNORMAL HIGH (ref 65–99)
Potassium: 2.8 mmol/L — ABNORMAL LOW (ref 3.5–5.1)
SODIUM: 138 mmol/L (ref 135–145)

## 2016-02-27 LAB — GLUCOSE, CAPILLARY
GLUCOSE-CAPILLARY: 154 mg/dL — AB (ref 65–99)
GLUCOSE-CAPILLARY: 135 mg/dL — AB (ref 65–99)
GLUCOSE-CAPILLARY: 96 mg/dL (ref 65–99)
GLUCOSE-CAPILLARY: 157 mg/dL — AB (ref 65–99)
Glucose-Capillary: 104 mg/dL — ABNORMAL HIGH (ref 65–99)
Glucose-Capillary: 114 mg/dL — ABNORMAL HIGH (ref 65–99)
Glucose-Capillary: 116 mg/dL — ABNORMAL HIGH (ref 65–99)

## 2016-02-27 MED ORDER — METOPROLOL TARTRATE 25 MG PO TABS
25.0000 mg | ORAL_TABLET | Freq: Two times a day (BID) | ORAL | Status: DC
  Filled 2016-02-27: qty 1

## 2016-02-27 MED ORDER — AMLODIPINE BESYLATE 10 MG PO TABS
10.0000 mg | ORAL_TABLET | Freq: Every day | ORAL | Status: DC
  Filled 2016-02-27: qty 1

## 2016-02-27 MED ORDER — AMLODIPINE BESYLATE 5 MG PO TABS
5.0000 mg | ORAL_TABLET | Freq: Every day | ORAL | Status: DC
  Administered 2016-02-27: 5 mg via ORAL
  Filled 2016-02-27: qty 1

## 2016-02-27 MED ORDER — AMLODIPINE BESYLATE 10 MG PO TABS
10.0000 mg | ORAL_TABLET | Freq: Every day | ORAL | Status: DC
  Administered 2016-02-28: 10 mg via ORAL
  Filled 2016-02-27: qty 1

## 2016-02-27 MED ORDER — FENTANYL CITRATE (PF) 100 MCG/2ML IJ SOLN
12.5000 ug | Freq: Once | INTRAMUSCULAR | Status: AC
  Administered 2016-02-27: 12.5 ug via INTRAVENOUS
  Filled 2016-02-27: qty 2

## 2016-02-27 MED ORDER — POTASSIUM CHLORIDE CRYS ER 20 MEQ PO TBCR
40.0000 meq | EXTENDED_RELEASE_TABLET | ORAL | Status: AC
Start: 2016-02-27 — End: 2016-02-27
  Administered 2016-02-27 (×2): 40 meq via ORAL
  Filled 2016-02-27 (×2): qty 2

## 2016-02-27 NOTE — Progress Notes (Signed)
eLink Physician-Brief Progress Note Patient Name: Raven GlennBrenda C Romanek DOB: 09/04/1956 MRN: [id number]   Date of Service  02/27/2016  HPI/Events of Note  Low k   eICU Interventions  k     Intervention Category Intermediate Interventions: Electrolyte abnormality - evaluation and management  Nelda BucksFEINSTEIN,DANIEL J. 02/27/2016, 3:32 AM

## 2016-02-27 NOTE — Progress Notes (Signed)
eLink Physician-Brief Progress Note Patient Name: Raven GlennBrenda C Sawyer DOB: 1956-04-27 MRN: [id number]   Date of Service  02/27/2016  HPI/Events of Note  htn with pain  eICU Interventions  Fent, re assess HTN     Intervention Category Intermediate Interventions: Communication with other healthcare providers and/or family;Hypertension - evaluation and management  FEINSTEIN,DANIEL J. 02/27/2016, 2:49 AM

## 2016-02-27 NOTE — Progress Notes (Addendum)
PULMONARY / CRITICAL CARE MEDICINE   Name: Raven GlennBrenda C Cauble MRN: [id number] DOB: December 18, 1955    ADMISSION DATE:  02/25/2016 CONSULTATION DATE: 02/25/16  REFERRING MD: Dr Mora Bellmanni, EDP  CHIEF COMPLAINT: HTN crisis, tingling in hands, HTN  HISTORY OF PRESENT ILLNESS: 60 yr old female, known h/o HTN and noncompliance last 6 months with meds. Pt brought to ED by [facility] from home for HTN for six months wo medication gotten worse today, BP 220/110 on EMS arrival, pt c/o 10/10 HA, 4 mg Zofran IV given by EMS PTA to ED. He also described tingling in bilateral hands. No back pain, no CP, no SOB. Treated nicardipine with improvement in BP and hand tingling eventually. CT head performed negative bleed. Called to admit the pt to hospital secondary to vaso active meds required. The etiology for noncomplainace was related to her changing MD as primary.   PAST MEDICAL HISTORY :  She  has a past medical history of Hypertension.  PAST SURGICAL HISTORY: She  has no past surgical history on file.  No Known Allergies  No current facility-administered medications on file prior to encounter.   Current Outpatient Prescriptions on File Prior to Encounter  Medication Sig  . hydrochlorothiazide (HYDRODIURIL) 25 MG tablet Take 1 tablet (25 mg total) by mouth daily. (Patient not taking: Reported on 02/26/2016)  . promethazine (PHENERGAN) 25 MG suppository Place 1 suppository (25 mg total) rectally every 6 (six) hours as needed for nausea. (Patient not taking: Reported on 02/26/2016)  . promethazine (PHENERGAN) 25 MG tablet Take 1 tablet (25 mg total) by mouth every 6 (six) hours as needed for nausea. (Patient not taking: Reported on 02/26/2016)    FAMILY HISTORY:  Her has no family status information on file.   SOCIAL HISTORY: She  reports that she has been smoking Cigarettes.  She has been smoking about 1.00 pack per day. She does not have any smokeless tobacco history on file. She reports that she does not drink  alcohol or use illicit drugs.  REVIEW OF SYSTEMS:    SUBJECTIVE:  Off Nicardipine drip  VITAL SIGNS: BP 155/105 mmHg  Pulse 95  Temp(Src) 98.6 F (37 C) (Oral)  Resp 15  Ht 5\' 4"  (1.626 m)  Wt 125 lb 10.6 oz (57 kg)  BMI 21.56 kg/m2  SpO2 98%  HEMODYNAMICS:    VENTILATOR SETTINGS:    INTAKE / OUTPUT: I/O last 3 completed shifts: In: 1662.5 [P.O.:790; I.V.:772.5; IV Piggyback:100] Out: 1000 [Urine:1000]  PHYSICAL EXAMINATION: General: No distress Neuro: AAA X 3 HEENT: Moist mucus membranes, No thyromegaly JVD Cardiovascular: RRR, no MRG Lungs: Clear. No wheeze or crackles Abdomen: Belly soft, nontender Musculoskeletal: Normal bulk and tone Skin: No rash or skin breakdown  LABS:  BMET  Recent Labs Lab 02/25/16 2351 02/26/16 0426 02/27/16 0200  NA 138 141 138  K 3.1* 3.1* 2.8*  CL 103 111 103  CO2 25 22 23   BUN 10 6 7   CREATININE 0.75 0.63 0.64  GLUCOSE 115* 147* 121*    Electrolytes  Recent Labs Lab 02/25/16 2351 02/26/16 0426 02/27/16 0200  CALCIUM 9.9 8.9 10.0  MG  --  1.8  --   PHOS  --  2.4*  --     CBC  Recent Labs Lab 02/25/16 2351 02/26/16 0426 02/27/16 0200  WBC 14.6* 7.6 9.4  HGB 13.3 12.5 13.1  HCT 40.8 38.7 40.3  PLT 407* 342 412*    Coag's  Recent Labs Lab 02/25/16 2351  APTT 32  INR 1.02    Sepsis Markers No results for input(s): LATICACIDVEN, PROCALCITON, O2SATVEN in the last 168 hours.  ABG No results for input(s): PHART, PCO2ART, PO2ART in the last 168 hours.  Liver Enzymes  Recent Labs Lab 02/25/16 2351  AST 26  ALT 19  ALKPHOS 61  BILITOT 0.6  ALBUMIN 4.3    Cardiac Enzymes  Recent Labs Lab 02/26/16 0426 02/26/16 0805 02/26/16 1336  TROPONINI <0.03 <0.03 <0.03    Glucose  Recent Labs Lab 02/26/16 0832 02/26/16 1106 02/26/16 1540 02/26/16 1928 02/27/16 0028 02/27/16 0332  GLUCAP 127* 128* 114* 141* 116* 154*    Imaging No results found.   STUDIES:  6/5 CT  head>>>neg  6/6 CXR>>Mild interstitial edema. 6/6 Echo >> EF 65-70%, grade 1 diastolic dysfunction. mid LV dynamic obstruction of between 16-31 mmHg.  CULTURES:  Toxicology: 6/5>>Positive for Opiates, Cocaine and Tetrahydrocannabinol   ANTIBIOTICS:  SIGNIFICANT EVENTS: 6 months- Not taking hTn meds 6/5- 210/120, nicardipine, ct head neg  LINES/TUBES: PIV x 1  ASSESSMENT / PLAN:  PULMONARY A: HTN crisis, r/o pulm edema CXR shows mild interstitial edema Auto diuresing, -300 since midnight. P:  Stable respiratory status  CARDIOVASCULAR A:  HTn emergency, r/o cardiomyoapthy Troponin < 0.03 P:  Echo reviewed Increase Norvasc to 10 mg.  Avoid beta blockers with H/O cocaine use HydralazinePRN Case Management for assist with home meds Needs PCP consult at discharge as not being followed   RENAL A:  Hypokalemia Low Phos P:  Repleted  GASTROINTESTINAL A:  HTN  P:  Low Na diet   HEMATOLOGIC A:  DVT prevention P:  Sub q heparin Mobilize  INFECTIOUS A:  Afebrile P:  Monitor temp curve/ WBC Observe off antibiotics  ENDOCRINE A:  At risk hyperglycemia TSH 0.360 P:  SSI   NEUROLOGIC A:  HTN Emergency R/o TIA Resolved Headache P:  Stable  FAMILY  - Updates: Patient updated at bedside 6/7. Stable for transfer to telemetry. - Inter-disciplinary family meet or Palliative Care meeting due by: 6/13  Chilton Greathouse MD [facility] Pager [phone] If no answer or after 3pm call: [phone] 02/27/2016, 8:22 AM

## 2016-02-28 LAB — BASIC METABOLIC PANEL
Anion gap: 7 (ref 5–15)
BUN: 13 mg/dL (ref 6–20)
CALCIUM: 10.2 mg/dL (ref 8.9–10.3)
CO2: 25 mmol/L (ref 22–32)
CREATININE: 0.76 mg/dL (ref 0.44–1.00)
Chloride: 105 mmol/L (ref 101–111)
Glucose, Bld: 101 mg/dL — ABNORMAL HIGH (ref 65–99)
Potassium: 3.5 mmol/L (ref 3.5–5.1)
SODIUM: 137 mmol/L (ref 135–145)

## 2016-02-28 LAB — GLUCOSE, CAPILLARY
Glucose-Capillary: 105 mg/dL — ABNORMAL HIGH (ref 65–99)
Glucose-Capillary: 116 mg/dL — ABNORMAL HIGH (ref 65–99)
Glucose-Capillary: 87 mg/dL (ref 65–99)

## 2016-02-28 LAB — PHOSPHORUS: PHOSPHORUS: 3.3 mg/dL (ref 2.5–4.6)

## 2016-02-28 LAB — CBC
HCT: 40.7 % (ref 36.0–46.0)
HEMOGLOBIN: 12.9 g/dL (ref 12.0–15.0)
MCH: 25.7 pg — ABNORMAL LOW (ref 26.0–34.0)
MCHC: 31.7 g/dL (ref 30.0–36.0)
MCV: 81.1 fL (ref 78.0–100.0)
PLATELETS: 405 10*3/uL — AB (ref 150–400)
RBC: 5.02 MIL/uL (ref 3.87–5.11)
RDW: 14.1 % (ref 11.5–15.5)
WBC: 7.2 10*3/uL (ref 4.0–10.5)

## 2016-02-28 LAB — MAGNESIUM: MAGNESIUM: 2.1 mg/dL (ref 1.7–2.4)

## 2016-02-28 MED ORDER — TRAMADOL HCL 50 MG PO TABS
100.0000 mg | ORAL_TABLET | Freq: Four times a day (QID) | ORAL | Status: DC | PRN
  Administered 2016-02-28: 100 mg via ORAL
  Filled 2016-02-28: qty 2

## 2016-02-28 MED ORDER — AMLODIPINE BESYLATE 10 MG PO TABS: 10.0000 mg | ORAL_TABLET | Freq: Every day | ORAL | Status: DC

## 2016-02-28 MED ORDER — TRAMADOL HCL 50 MG PO TABS: 50.0000 mg | ORAL_TABLET | Freq: Four times a day (QID) | ORAL | Status: DC | PRN

## 2016-02-28 NOTE — Progress Notes (Signed)
Raven GlennBrenda C Ellington to be D/C'd Home per MD order.  Discussed with the patient and all questions fully answered.  Patient's RN Ginger stated that patient is okay to discharge.    An After Visit Summary was printed and given to the patient. Patient received prescription.  D/c education completed with patient/family including follow up instructions, medication list, d/c activities limitations if indicated, with other d/c instructions as indicated by MD - patient able to verbalize understanding, all questions fully answered.   Patient instructed to return to ED, call 911, or call MD for any changes in condition.   Patient to be escorted via WC, and D/C home via private auto.  L'ESPERANCE, [name] C 02/28/2016 3:32 PM

## 2016-02-28 NOTE — Care Management Note (Signed)
Case Management Note  Patient Details  Name: [name] MRN: [id number] Date of Birth: 04-24-1956  Subjective/Objective:             Admitted with HTN emergency.       Action/Plan: Discharge home to day. Post hospital follow up scheduled  04/02/2016 @ 11:00am, [facility]).  Expected Discharge Date:    02/28/2016           Expected Discharge Plan:  Home/Self Care  In-House Referral:     Discharge planning Services  CM Consult, [facility]  Post Acute Care Choice:    Choice offered to:     DME Arranged:    DME Agency:     HH Arranged:    HH Agency:     Status of Service:  completed  Medicare Important Message Given:    Date Medicare IM Given:    Medicare IM give by:    Date Additional Medicare IM Given:    Additional Medicare Important Message give by:     If discussed at Long Length of Stay Meetings, dates discussed:    Additional Comments:  Epifanio LeschesCole, Chanice Brenton Hudson, RN 02/28/2016, 3:26 PM

## 2016-02-28 NOTE — Progress Notes (Signed)
Nsg Discharge Note  Admit Date:  02/25/2016 Discharge date: 02/28/2016   Maxine GlennBrenda C Hansman to be D/C'd Home per MD order.  AVS completed.  Copy for chart, and copy for patient signed, and dated. Patient/caregiver able to verbalize understanding.  Discharge Medication:   Medication List    STOP taking these medications        hydrochlorothiazide 25 MG tablet  Commonly known as:  HYDRODIURIL     ibuprofen 200 MG tablet  Commonly known as:  ADVIL,MOTRIN     promethazine 25 MG suppository  Commonly known as:  PHENERGAN     promethazine 25 MG tablet  Commonly known as:  PHENERGAN      TAKE these medications        acetaminophen 325 MG tablet  Commonly known as:  TYLENOL  Take 650 mg by mouth every 6 (six) hours as needed for mild pain.     amLODipine 10 MG tablet  Commonly known as:  NORVASC  Take 1 tablet (10 mg total) by mouth daily.     traMADol 50 MG tablet  Commonly known as:  ULTRAM  Take 1 tablet (50 mg total) by mouth every 6 (six) hours as needed for severe pain.        Discharge Assessment: Filed Vitals:   02/28/16 0645 02/28/16 1430  BP: 155/47 144/87  Pulse: 73 81  Temp:  97.9 F (36.6 C)  Resp:  18   Skin clean, dry and intact without evidence of skin break down, no evidence of skin tears noted. Patient does have healing burns to BLE posterior from heating pads at home. IV catheter discontinued intact. Site without signs and symptoms of complications - no redness or edema noted at insertion site, patient denies c/o pain - only slight tenderness at site.  Dressing with slight pressure applied.  D/c Instructions-Education: Discharge instructions given to patient/family with verbalized understanding. D/c education completed with patient/family including follow up instructions, medication list, d/c activities limitations if indicated, with other d/c instructions as indicated by MD - patient able to verbalize understanding, all questions fully answered. Patient  instructed to return to ED, call 911, or call MD for any changes in condition.  Patient escorted via WC, and D/C home via private auto.  Shana Zavaleta Consuella Loselaine, RN 02/28/2016 4:11 PM

## 2016-02-29 MED FILL — traMADol HCL 50 MG TABS: 50 | 4 days supply | Qty: 15 | Fill #0

## 2016-02-29 MED FILL — ?AMLODIPINE BESYLATE 10 MG: 10 | 30 days supply | Qty: 30 | Fill #0

## 2016-02-29 NOTE — Discharge Summary (Signed)
Physician Discharge Summary  Raven Sawyer EAV:[id number] DOB: 03/20/1956 DOA: 02/25/2016  PCP: Default, Provider, MD  Admit date: 02/25/2016 Discharge date: 02/29/2016  Admitted From:HOME Disposition:  HOME  Recommendations for Outpatient Follow-up:  1. Follow up with PCP in 1-2 weeks 2. Please obtain BMP/CBC in one week   Home HealthYES Equipment/Devices:YES  Discharge Condition:STABLE CODE STATUS:FULL  Diet recommendation: Heart Healthy   Brief/Interim Summary: 60 yr old female, known h/o HTN and noncompliance last 6 months with meds, PRESENTS with hypertensive emergency, with some nausea and tingling of the hands. Initial CT head negative.  She was started on blood pressure medications and her meds optimized. She was asymptomatic on discharge.   Her UDS was positive for cocaine and marijuana. Cessation advised.   Discharge Diagnoses:  Active Problems:   Hypertensive emergency    Discharge Instructions  Discharge Instructions    Diet - low sodium heart healthy    Complete by:  As directed      Discharge instructions    Complete by:  As directed   Please follow up with PCP in one week.            Medication List    STOP taking these medications        hydrochlorothiazide 25 MG tablet  Commonly known as:  HYDRODIURIL     ibuprofen 200 MG tablet  Commonly known as:  ADVIL,MOTRIN     promethazine 25 MG suppository  Commonly known as:  PHENERGAN     promethazine 25 MG tablet  Commonly known as:  PHENERGAN      TAKE these medications        acetaminophen 325 MG tablet  Commonly known as:  TYLENOL  Take 650 mg by mouth every 6 (six) hours as needed for mild pain.     amLODipine 10 MG tablet  Commonly known as:  NORVASC  Take 1 tablet (10 mg total) by mouth daily.     traMADol 50 MG tablet  Commonly known as:  ULTRAM  Take 1 tablet (50 mg total) by mouth every 6 (six) hours as needed for severe pain.           Follow-up Information    Follow up  with [facility]. Go on 04/02/2016.   Why:  post hospital followup scheduled on7/08/2016 at 11:00am   Contact information:   [address] [place] [address]      No Known Allergies  Consultations:  PCCM   Procedures/Studies: Ct Head Wo Contrast  02/26/2016  CLINICAL DATA:  60 year old female with elevated blood pressure and nausea. Severe headache and back pain. EXAM: CT HEAD WITHOUT CONTRAST TECHNIQUE: Contiguous axial images were obtained from the base of the skull through the vertex without intravenous contrast. COMPARISON:  None. FINDINGS: The ventricles and sulci are appropriate in size for patient's age. Minimal periventricular and deep white matter chronic microvascular ischemic changes noted. There is no acute intracranial hemorrhage. No mass effect or midline shift. The visualized paranasal sinuses and mastoid air cells are clear. The calvarium is intact. IMPRESSION: No acute intracranial pathology. Electronically Signed   By: Elgie Collard M.D.   On: 02/26/2016 00:57   Dg Chest Port 1 View  02/26/2016  CLINICAL DATA:  Assess edema.  Hypertensive emergency. EXAM: PORTABLE CHEST 1 VIEW COMPARISON:  10/22/2012 FINDINGS: Normal heart size for technique. Normal aortic and hilar contours. Mild interstitial coarsening correlating with history of edema in this patient with hypertensive emergency.  No effusion or pneumothorax. IMPRESSION: Mild interstitial edema. Electronically Signed   By: Marnee SpringJonathon  Watts M.D.   On: 02/26/2016 02:54    (Echo, Carotid, EGD, Colonoscopy, ERCP)    Subjective: No complaints, .   Discharge Exam: Filed Vitals:   02/28/16 0645 02/28/16 1430  BP: 155/47 144/87  Pulse: 73 81  Temp:  97.9 F (36.6 C)  Resp:  18   Filed Vitals:   02/28/16 0500 02/28/16 0551 02/28/16 0645 02/28/16 1430  BP:  172/98 155/47 144/87  Pulse:  63 73 81  Temp:  98.2 F (36.8 C)  97.9 F (36.6 C)  TempSrc:    Oral  Resp:  18  18   Height:      Weight: 56.7 kg (125 lb)     SpO2:  99% 100% 100%    General: Pt is alert, awake, not in acute distress Cardiovascular: RRR, S1/S2 +, no rubs, no gallops Respiratory: CTA bilaterally, no wheezing, no rhonchi Abdominal: Soft, NT, ND, bowel sounds + Extremities: no edema, no cyanosis    The results of significant diagnostics from this hospitalization (including imaging, microbiology, ancillary and laboratory) are listed below for reference.     Microbiology: Recent Results (from the past 240 hour(s))  MRSA PCR Screening     Status: None   Collection Time: 02/26/16  5:16 AM  Result Value Ref Range Status   MRSA by PCR NEGATIVE NEGATIVE Final    Comment:        The GeneXpert MRSA Assay (FDA approved for NASAL specimens only), is one component of a comprehensive MRSA colonization surveillance program. It is not intended to diagnose MRSA infection nor to guide or monitor treatment for MRSA infections.      Labs: BNP (last 3 results) No results for input(s): BNP in the last 8760 hours. Basic Metabolic Panel:  Recent Labs Lab 02/25/16 2337 02/25/16 2351 02/26/16 0426 02/27/16 0200 02/28/16 0511  NA 140 138 141 138 137  K 3.0* 3.1* 3.1* 2.8* 3.5  CL 101 103 111 103 105  CO2  --  25 22 23 25   GLUCOSE 115* 115* 147* 121* 101*  BUN 9 10 6 7 13   CREATININE 0.70 0.75 0.63 0.64 0.76  CALCIUM  --  9.9 8.9 10.0 10.2  MG  --   --  1.8  --  2.1  PHOS  --   --  2.4*  --  3.3   Liver Function Tests:  Recent Labs Lab 02/25/16 2351  AST 26  ALT 19  ALKPHOS 61  BILITOT 0.6  PROT 8.4*  ALBUMIN 4.3   No results for input(s): LIPASE, AMYLASE in the last 168 hours. No results for input(s): AMMONIA in the last 168 hours. CBC:  Recent Labs Lab 02/25/16 2337 02/25/16 2351 02/26/16 0426 02/27/16 0200 02/28/16 0511  WBC  --  14.6* 7.6 9.4 7.2  NEUTROABS  --  11.0*  --   --   --   HGB 15.0 13.3 12.5 13.1 12.9  HCT 44.0 40.8 38.7 40.3 40.7  MCV  --   81.6 80.1 79.3 81.1  PLT  --  407* 342 412* 405*   Cardiac Enzymes:  Recent Labs Lab 02/26/16 0426 02/26/16 0805 02/26/16 1336  TROPONINI <0.03 <0.03 <0.03   BNP: Invalid input(s): POCBNP CBG:  Recent Labs Lab 02/27/16 1950 02/27/16 2349 02/28/16 0355 02/28/16 0738 02/28/16 1204  GLUCAP 157* 114* 105* 116* 87   D-Dimer No results for input(s): DDIMER in the last 72 hours.  Hgb A1c No results for input(s): HGBA1C in the last 72 hours. Lipid Profile No results for input(s): CHOL, HDL, LDLCALC, TRIG, CHOLHDL, LDLDIRECT in the last 72 hours. Thyroid function studies No results for input(s): TSH, T4TOTAL, T3FREE, THYROIDAB in the last 72 hours.  Invalid input(s): FREET3 Anemia work up No results for input(s): VITAMINB12, FOLATE, FERRITIN, TIBC, IRON, RETICCTPCT in the last 72 hours. Urinalysis    Component Value Date/Time   COLORURINE YELLOW 02/25/2016 2325   APPEARANCEUR TURBID* 02/25/2016 2325   LABSPEC 1.017 02/25/2016 2325   PHURINE 8.5* 02/25/2016 2325   GLUCOSEU NEGATIVE 02/25/2016 2325   HGBUR NEGATIVE 02/25/2016 2325   BILIRUBINUR NEGATIVE 02/25/2016 2325   KETONESUR 15* 02/25/2016 2325   PROTEINUR NEGATIVE 02/25/2016 2325   UROBILINOGEN 1.0 10/22/2012 1944   NITRITE NEGATIVE 02/25/2016 2325   LEUKOCYTESUR NEGATIVE 02/25/2016 2325   Sepsis Labs Invalid input(s): PROCALCITONIN,  WBC,  LACTICIDVEN Microbiology Recent Results (from the past 240 hour(s))  MRSA PCR Screening     Status: None   Collection Time: 02/26/16  5:16 AM  Result Value Ref Range Status   MRSA by PCR NEGATIVE NEGATIVE Final    Comment:        The GeneXpert MRSA Assay (FDA approved for NASAL specimens only), is one component of a comprehensive MRSA colonization surveillance program. It is not intended to diagnose MRSA infection nor to guide or monitor treatment for MRSA infections.      Time coordinating discharge: Over 30 minutes  SIGNED:   Kathlen Mody, MD  [facility] 02/29/2016, 8:58 AM Pager [phone]   If 7PM-7AM, please contact night-coverage [email or web address] Password [email or web address]

## 2016-03-27 MED FILL — ?AMLODIPINE BESYLATE 10 MG: 10 | 30 days supply | Qty: 30 | Fill #1

## 2016-04-02 ENCOUNTER — Encounter: Payer: Self-pay | Admitting: Family Medicine

## 2016-04-02 ENCOUNTER — Ambulatory Visit (INDEPENDENT_AMBULATORY_CARE_PROVIDER_SITE_OTHER): Payer: Self-pay | Admitting: Family Medicine

## 2016-04-02 ENCOUNTER — Ambulatory Visit: Payer: Self-pay | Admitting: Family Medicine

## 2016-04-02 VITALS — BP 124/92 | HR 95 | Temp 98.7°F | Resp 16 | Ht 64.0 in | Wt 125.0 lb

## 2016-04-02 DIAGNOSIS — M25562 Pain in left knee: Secondary | ICD-10-CM

## 2016-04-02 DIAGNOSIS — I10 Essential (primary) hypertension: Secondary | ICD-10-CM

## 2016-04-02 DIAGNOSIS — J019 Acute sinusitis, unspecified: Secondary | ICD-10-CM

## 2016-04-02 DIAGNOSIS — Z7189 Other specified counseling: Secondary | ICD-10-CM

## 2016-04-02 DIAGNOSIS — Z7689 Persons encountering health services in other specified circumstances: Secondary | ICD-10-CM

## 2016-04-02 DIAGNOSIS — M25561 Pain in right knee: Secondary | ICD-10-CM

## 2016-04-02 MED ORDER — AMOXICILLIN 500 MG PO CAPS: 500.0000 mg | ORAL_CAPSULE | Freq: Three times a day (TID) | ORAL | Status: DC

## 2016-04-02 MED ORDER — AMLODIPINE BESYLATE 10 MG PO TABS: 10.0000 mg | ORAL_TABLET | Freq: Every day | ORAL | Status: DC

## 2016-04-02 MED FILL — AMOXICILLIN 500 MG CAPSULE: 500 | 10 days supply | Qty: 30 | Fill #0

## 2016-04-02 NOTE — Patient Instructions (Signed)
Call us when your medicaid comes through and we will refer you to a pain clinic. Take your BP medication regularly We need to address some health maintenance needs like pap and mammogram in the near future.

## 2016-04-03 DIAGNOSIS — F1120 Opioid dependence, uncomplicated: Secondary | ICD-10-CM | POA: Insufficient documentation

## 2016-04-03 NOTE — Progress Notes (Signed)
Patient ID: Raven Sawyer, female   DOB: [birth year], 60 y.o.   MRN: [id number]   Raven OrganBrenda Schirtzinger, is a 60 y.o. female  [id number]  [id number]  DOB - [birth year]  CC:  Chief Complaint  Patient presents with  . Otalgia    ear pain and face pain on right side x4 days    . Establish Care  . Leg Pain    bilateral        HPI: Raven Sawyer is a 60 y.o. female here to establish care. She has had many visits to ED or urgent care and was referred here to establish primary care. Her main complaint is lower leg pain. She is requesting pain medication. She also complains offrontal headace , sinus pain and right ear discomfort.     On a recent visit to ED, here urine was positive for opiates, cocaine and marijuana. She denies further use of opiates, cocaine or alcohol but admits to use of marajuana and smokes one pack of cigarettes daily. Her only currently prescribed medications is Amlodipine for blood pressure. She calls her pain 'restless leg syndrome' and decribes is as a deep aching in shaft of lower legs and some in knees. She reports some significant relief with a heating pad. She current has no insurance but has applied for medicaid. She would like to delay health maintenance issues until that is in hopefully in place. She has had recent bloodwork and there is nothing that demands repeating today.  She does need a tetanus booster, screening for HIV and Hep C, mammogram, PAP and colon cancer screening. Her BP today is 124/92  No Known Allergies Past Medical History  Diagnosis Date  . Hypertension    Current Outpatient Prescriptions on File Prior to Visit  Medication Sig Dispense Refill  . acetaminophen (TYLENOL) 325 MG tablet Take 650 mg by mouth every 6 (six) hours as needed for mild pain. Reported on 04/02/2016    . traMADol (ULTRAM) 50 MG tablet Take 1 tablet (50 mg total) by mouth every 6 (six) hours as needed for severe pain. (Patient not taking: Reported on 04/02/2016) 15 tablet 0    No current facility-administered medications on file prior to visit.   History reviewed. No pertinent family history. Social History   Social History  . Marital Status: Married    Spouse Name: N/A  . Number of Children: N/A  . Years of Education: N/A   Occupational History  . Not on file.   Social History Main Topics  . Smoking status: Current Every Day Smoker -- 0.50 packs/day    Types: Cigarettes  . Smokeless tobacco: Not on file  . Alcohol Use: Yes     Comment: occ  . Drug Use: Yes    Special: Marijuana     Comment: smokes marjuana daily   . Sexual Activity: Yes    Birth Control/ Protection: Condom   Other Topics Concern  . Not on file   Social History Narrative    Review of Systems: Constitutional: Negative for fever, chills. Reports fatigue and loss of appetite Skin: Negative for rashes or lesions of concern. HENT: Negative  ear discharge.nose bleeds. Positive for right ear discomfort. Reports sore throat for 2-3 days.  Eyes: Negative for pain, discharge, redness, itching and visual disturbance. Neck: Negative for pain, stiffness Respiratory: Negative for cough, shortness of breath,   Cardiovascular: Negative for chest pain, palpitations and leg swelling. Gastrointestinal: Negative for abdominal pain, nausea, vomiting, diarrhea, constipations Genitourinary: Negative for dysuria, urgency,  frequency, hematuria,  Musculoskeletal: Negative for back pain, joint pain, joint  swelling, and gait problem.Negative for weakness. Positive for bilateral leg pain Neurological: Negative for dizziness, tremors, seizures, syncope,   light-headedness, numbness. Positive for headaches Hematological: Negative for easy bruising or bleeding Psychiatric/Behavioral: Negative for depression, anxiety, decreased concentration, confusion   Objective:   Filed Vitals:   04/02/16 1123  BP: 124/92  Pulse: 95  Temp: 98.7 F (37.1 C)  Resp: 16    Physical Exam: Constitutional:  Patient appears well-developed and well-nourished. No distress. HENT: Normocephalic, atraumatic, External right and left ear normal. Oropharynx is clear and moist.  Eyes: Conjunctivae and EOM are normal. PERRLA, no scleral icterus. Neck: Normal ROM. Neck supple. No lymphadenopathy, No thyromegaly. CVS: RRR, S1/S2 +, no murmurs, no gallops, no rubs Pulmonary: Effort and breath sounds normal, no stridor, rhonchi, wheezes, rales.  Abdominal: Soft. Normoactive BS,, no distension, tenderness, rebound or guarding.  Musculoskeletal: Normal range of motion. No edema and no tenderness.  Neuro: Alert.Normal muscle tone coordination. Non-focal Skin: Skin is warm and dry. No rash noted. Not diaphoretic. No erythema. No pallor. There is mottling and minor peeling of posterior calves from excessive use of heating pad. Psychiatric: Normal mood and affect. Behavior, judgment, thought content normal.  Lab Results  Component Value Date   WBC 7.2 02/28/2016   HGB 12.9 02/28/2016   HCT 40.7 02/28/2016   MCV 81.1 02/28/2016   PLT 405* 02/28/2016   Lab Results  Component Value Date   CREATININE 0.76 02/28/2016   BUN 13 02/28/2016   NA 137 02/28/2016   K 3.5 02/28/2016   CL 105 02/28/2016   CO2 25 02/28/2016    No results found for: HGBA1C Lipid Panel  No results found for: CHOL, TRIG, HDL, CHOLHDL, VLDL, LDLCALC     Assessment and plan:   1. Encounter to establish care -Have reviewed information provided by the patient and information available in the chart.   2. Arthralgia of both lower legs -Further assessment and possible referral to pain clinic after insurance in place.   3. Acute sinusitis, recurrence not specified, unspecified location  - amoxicillin (AMOXIL) 500 MG capsule; Take 1 capsule (500 mg total) by mouth 3 (three) times daily.  Dispense: 30 capsule; Refill: 0  4. Essential hypertension  - amLODipine (NORVASC) 10 MG tablet; Take 1 tablet (10 mg total) by mouth daily.   Dispense: 90 tablet; Refill: 1   Return in about 3 months (around 07/03/2016) for HTN.  The patient was given clear instructions to go to ER or return to medical center if symptoms don't improve, worsen or new problems develop. The patient verbalized understanding.    Henrietta Hoover FNP  04/03/2016, 7:41 AM

## 2016-04-25 MED FILL — AMLODIPINE BESYLATE 10 MG T: 10 | 30 days supply | Qty: 30 | Fill #0

## 2016-05-28 MED FILL — AMLODIPINE BESYLATE 10 MG T: 10 | 30 days supply | Qty: 30 | Fill #1

## 2016-06-27 MED FILL — AMLODIPINE BESYLATE 10 MG T: 10 | 30 days supply | Qty: 30 | Fill #2

## 2016-07-04 ENCOUNTER — Ambulatory Visit: Payer: Self-pay

## 2016-07-07 ENCOUNTER — Ambulatory Visit: Payer: Self-pay

## 2016-07-14 ENCOUNTER — Ambulatory Visit: Payer: Self-pay | Admitting: Family Medicine

## 2016-07-18 ENCOUNTER — Ambulatory Visit: Payer: Self-pay

## 2016-07-21 ENCOUNTER — Ambulatory Visit: Payer: Self-pay

## 2016-07-30 MED FILL — AMLODIPINE BESYLATE 10 MG T: 10 | 30 days supply | Qty: 30 | Fill #3

## 2016-08-20 ENCOUNTER — Ambulatory Visit: Payer: Self-pay | Admitting: Family Medicine

## 2016-09-01 ENCOUNTER — Ambulatory Visit (INDEPENDENT_AMBULATORY_CARE_PROVIDER_SITE_OTHER): Payer: Self-pay | Admitting: Internal Medicine

## 2016-09-01 ENCOUNTER — Encounter (INDEPENDENT_AMBULATORY_CARE_PROVIDER_SITE_OTHER): Payer: Self-pay

## 2016-09-01 VITALS — BP 164/94 | HR 114 | Temp 97.6°F | Wt 134.9 lb

## 2016-09-01 DIAGNOSIS — M79651 Pain in right thigh: Secondary | ICD-10-CM

## 2016-09-01 DIAGNOSIS — M79662 Pain in left lower leg: Secondary | ICD-10-CM

## 2016-09-01 DIAGNOSIS — M79605 Pain in left leg: Principal | ICD-10-CM

## 2016-09-01 DIAGNOSIS — Z8349 Family history of other endocrine, nutritional and metabolic diseases: Secondary | ICD-10-CM

## 2016-09-01 DIAGNOSIS — Z0000 Encounter for general adult medical examination without abnormal findings: Secondary | ICD-10-CM

## 2016-09-01 DIAGNOSIS — G8929 Other chronic pain: Secondary | ICD-10-CM

## 2016-09-01 DIAGNOSIS — G894 Chronic pain syndrome: Secondary | ICD-10-CM

## 2016-09-01 DIAGNOSIS — I10 Essential (primary) hypertension: Secondary | ICD-10-CM

## 2016-09-01 DIAGNOSIS — M79652 Pain in left thigh: Secondary | ICD-10-CM

## 2016-09-01 DIAGNOSIS — M25561 Pain in right knee: Secondary | ICD-10-CM

## 2016-09-01 DIAGNOSIS — F1220 Cannabis dependence, uncomplicated: Secondary | ICD-10-CM

## 2016-09-01 DIAGNOSIS — Z79899 Other long term (current) drug therapy: Secondary | ICD-10-CM

## 2016-09-01 DIAGNOSIS — Z833 Family history of diabetes mellitus: Secondary | ICD-10-CM

## 2016-09-01 DIAGNOSIS — M25562 Pain in left knee: Secondary | ICD-10-CM

## 2016-09-01 DIAGNOSIS — M79604 Pain in right leg: Principal | ICD-10-CM

## 2016-09-01 DIAGNOSIS — F17210 Nicotine dependence, cigarettes, uncomplicated: Secondary | ICD-10-CM

## 2016-09-01 DIAGNOSIS — E785 Hyperlipidemia, unspecified: Secondary | ICD-10-CM

## 2016-09-01 DIAGNOSIS — M79661 Pain in right lower leg: Secondary | ICD-10-CM

## 2016-09-01 LAB — POCT GLYCOSYLATED HEMOGLOBIN (HGB A1C): Hemoglobin A1C: 5.7

## 2016-09-01 LAB — GLUCOSE, CAPILLARY: Glucose-Capillary: 136 mg/dL — ABNORMAL HIGH (ref 65–99)

## 2016-09-01 MED ORDER — AMLODIPINE BESYLATE 10 MG PO TABS: 10.0000 mg | ORAL_TABLET | Freq: Every day | ORAL | 1 refills | Status: DC

## 2016-09-01 MED ORDER — TRAMADOL HCL 50 MG PO TABS: 50.0000 mg | ORAL_TABLET | Freq: Four times a day (QID) | ORAL | 0 refills | Status: DC | PRN

## 2016-09-01 MED ORDER — DULOXETINE HCL 30 MG PO CPEP: ORAL_CAPSULE | ORAL | 2 refills | Status: DC

## 2016-09-01 MED FILL — AMLODIPINE BESYLATE 10 MG T: 10 | 30 days supply | Qty: 30 | Fill #4

## 2016-09-01 MED FILL — DULoxetine HCL 30 MG CPEP: 30 | 30 days supply | Qty: 60 | Fill #0

## 2016-09-01 MED FILL — traMADol HCL 50 MG TABS: 50 | 8 days supply | Qty: 30 | Fill #0

## 2016-09-01 NOTE — Progress Notes (Addendum)
   CC: Establish care, chronic leg pain  HPI:  [name] is a 60 y.o. female with PMHx detailed below presenting to establish care with our clinic. Her main concern is the chronic pain in her legs extending from her calves to her thighs. This pain wakes her up at night and is worse with activity - making it hard for her to function around the house and running errands during the day.  See problem based assessment and plan below for additional details.  Past Medical History:  Diagnosis Date  . Arthritis   . Chronic pain of lower extremity, bilateral   . Chronic sinus congestion   . Hypertension   . Tobacco abuse    Past Surgical History:  Procedure Laterality Date  . CESAREAN SECTION    . NASAL SEPTUM SURGERY     Family History  Problem Relation Age of Onset  . Arthritis Mother   Multiple family members with arthritis, hypertension, several non-first degree relatives with diabetes.  Social History   Social History  . Marital status: Married    Spouse name: N/A  . Number of children: N/A  . Years of education: N/A   Social History Main Topics  . Smoking status: Current Every Day Smoker    Packs/day: 0.50    Types: Cigarettes  . Smokeless tobacco: Never Used  . Alcohol use Yes     Comment: occ  . Drug use:     Types: Marijuana     Comment: smokes marjuana daily   . Sexual activity: Yes    Birth control/ protection: Condom   Other Topics Concern  . None   Social History Narrative   Lives with [place] and son in [place].   Review of Systems: Review of Systems  Constitutional: Negative for chills, fever, malaise/fatigue and weight loss.  Respiratory: Negative for cough, shortness of breath and wheezing.   Cardiovascular: Negative for chest pain, palpitations and orthopnea.  Gastrointestinal: Negative for abdominal pain, blood in stool, diarrhea, melena, nausea and vomiting.  Psychiatric/Behavioral: Negative for depression. The patient is not  nervous/anxious.   All other systems reviewed and are negative.   Physical Exam: Vitals:   09/01/16 0922  BP: (!) 164/94  Pulse: (!) 114  Temp: 97.6 F (36.4 C)  TempSrc: Oral  SpO2: 100%  Weight: 134 lb 14.4 oz (61.2 kg)   Body mass index is 23.16 kg/m. GENERAL- Woman sitting comfortably on exam table, alert, in no distress, conversational, energetic HEENT- Atraumatic, PERRL, EOMI, moist mucous membranes CARDIAC- Mildly tachycardic rate and regular rhythm, no murmurs, rubs or gallops. RESP- Clear to ascultation bilaterally, no wheezing or crackles, normal work of breathing ABDOMEN- Soft, nontender, nondistended BACK- Normal curvature, mild paraspinal and thoracic spinal tenderness, pain to gentle palpation with muscle spasm EXTREMITIES- Normal bulk and range of motion, no edema, 1+ peripheral pulses, knees and legs non-tender to palpation SKIN- Warm, dry, posterior calves and distal thighs with hyperpigmented and heterogeneous symmetric skin changes, no other lesions PSYCH- Energetic affect, clear but often pressured speech, thoughts linear and goal-directed  Assessment & Plan:   See encounters tab for problem based medical decision making.  Patient seen with Dr. Rogelia BogaButcher

## 2016-09-01 NOTE — Patient Instructions (Signed)
Please continue to take your medications as prescribed.  We have prescribed Cymbalta - take 1 capsule (30 mg) daily for the next week, then increase to 2 capsules (60mg ) daily.  We have prescribed a short course of Tramadol to help control your pain, please take this as needed.  Our schedulers should call to schedule the leg x-rays and ABI (blood vessel) tests.  We will call if any of your lab results are abnormal.  Please return in 2-3 weeks to follow up your pain control.

## 2016-09-01 NOTE — Assessment & Plan Note (Addendum)
Reports chronic pain of her thighs, knees, calves bilaterally that she characterizes as a "deep bone pain." She has had this pain for decades and it impairs her ability to walk (worse with movement) and function at home and outside her home. Her pain bothers her the most because she is active person that likes to get things done and it gets in her way. She reports the pain is constant, worse at no particular time of day  sharp/aching, severe, and when it gets triggered/exacerbated she has to stop and sit or lay down, use her heating, and take OTC medication. She reports taking Ibuprofen 4 tablets every 4 hours, in addition to "some aspirin and Tylenol." She reports her pain used to be better controlled when she was seeing [place] doctor [place] in [place] and was getting Rx Norco and Carisoprodol Production manager(Soma). This provider abruptly left and she has been without regular follow up for over a year. Checked [place] substance database and received only one short Rx for Tramadol in last 1.5 years. She smokes marijuana often for pain control and also has received Vicodin from a friend, for days (such as today) when she know she needs to do a lot of walking. She also uses a heating pad nightly under her legs and this cause caused chronic skin discoloration and signs of breakdown. She reports that providers have tried Gabapentin and Lyrica in the past with little to no effect. She report no provider has tried to work up the cause of this pain but she thinks it may be hereditary or related to arthritis that runs in multiple members of her family. No joint deformity on exam, ambulates with some limp, non-tender to palpation, posterior calf with skin changes, back hypersensitive to minimal palpation, trace/1+ pulses in lower extremities.  Assessment: chronic symmetric leg pain of unclear etiology  Plan: - Obtain XR knees, tibia/fibula bilaterally - Obtain ABI - Start Cymbalta 30mg  daily for 1 week then increase to 60mg  daily for  chronic pain - Provided Tramadol 50mg  #30 to take as needed and instructed to follow up in two weeks, hope to avoid long term opioids in this patient - May benefit from Voltaren gel, reassess at future visit - May require skin biopsy and possible assessment for rheumatologic disorder (although no other joints/areas are affected)

## 2016-09-01 NOTE — Assessment & Plan Note (Signed)
Reports family history of HLD, and DM. No records or previous screening. Refused flu vaccine, but states she can return for it later.  Plan: - Check lipid panel, HbA1c, HCV and HIV lifetime screen

## 2016-09-01 NOTE — Assessment & Plan Note (Addendum)
BP 164/94 today, reports that she forgot to take her Amlodipine today.  Plan: - Continue Amlodipine 10mg  daily - Encouraged low salt diet - Reassess at future visit

## 2016-09-02 DIAGNOSIS — E785 Hyperlipidemia, unspecified: Secondary | ICD-10-CM | POA: Insufficient documentation

## 2016-09-02 LAB — CMP14 + ANION GAP
ALK PHOS: 94 IU/L (ref 39–117)
ALT: 15 IU/L (ref 0–32)
AST: 14 IU/L (ref 0–40)
Albumin/Globulin Ratio: 1.2 (ref 1.2–2.2)
Albumin: 4.4 g/dL (ref 3.6–4.8)
Anion Gap: 21 mmol/L — ABNORMAL HIGH (ref 10.0–18.0)
BUN/Creatinine Ratio: 9 — ABNORMAL LOW (ref 12–28)
BUN: 7 mg/dL — AB (ref 8–27)
CHLORIDE: 102 mmol/L (ref 96–106)
CO2: 19 mmol/L (ref 18–29)
Calcium: 10.2 mg/dL (ref 8.7–10.3)
Creatinine, Ser: 0.75 mg/dL (ref 0.57–1.00)
GFR calc Af Amer: 99 mL/min/{1.73_m2} (ref >59)
GFR calc non Af Amer: 86 mL/min/{1.73_m2} (ref >59)
GLUCOSE: 139 mg/dL — AB (ref 65–99)
Globulin, Total: 3.7 g/dL (ref 1.5–4.5)
Potassium: 4.1 mmol/L (ref 3.5–5.2)
Sodium: 142 mmol/L (ref 134–144)
Total Protein: 8.1 g/dL (ref 6.0–8.5)

## 2016-09-02 LAB — HEPATITIS C ANTIBODY: HEP C VIRUS AB: 0.1 {s_co_ratio} (ref 0.0–0.9)

## 2016-09-02 LAB — CBC
Hematocrit: 42.4 % (ref 34.0–46.6)
Hemoglobin: 13.7 g/dL (ref 11.1–15.9)
MCH: 26.9 pg (ref 26.6–33.0)
MCHC: 32.3 g/dL (ref 31.5–35.7)
MCV: 83 fL (ref 79–97)
PLATELETS: 783 10*3/uL — AB (ref 150–379)
RBC: 5.1 x10E6/uL (ref 3.77–5.28)
RDW: 14.3 % (ref 12.3–15.4)
WBC: 10.1 10*3/uL (ref 3.4–10.8)

## 2016-09-02 LAB — LIPID PANEL
CHOLESTEROL TOTAL: 232 mg/dL — AB (ref 100–199)
Chol/HDL Ratio: 6.3 ratio units — ABNORMAL HIGH (ref 0.0–4.4)
HDL: 37 mg/dL — AB (ref >39)
LDL CALC: 153 mg/dL — AB (ref 0–99)
TRIGLYCERIDES: 208 mg/dL — AB (ref 0–149)
VLDL CHOLESTEROL CAL: 42 mg/dL — AB (ref 5–40)

## 2016-09-02 LAB — HIV ANTIBODY (ROUTINE TESTING W REFLEX): HIV Screen 4th Generation wRfx: NONREACTIVE

## 2016-09-02 MED ORDER — PRAVASTATIN SODIUM 40 MG PO TABS: 40.0000 mg | ORAL_TABLET | Freq: Every evening | ORAL | 3 refills | Status: AC

## 2016-09-02 MED ORDER — ASPIRIN EC 81 MG PO TBEC: 81.0000 mg | DELAYED_RELEASE_TABLET | Freq: Every day | ORAL | 1 refills | Status: AC

## 2016-09-02 MED FILL — PRAVASTATIN NA 40 MG TAB: 40 | 30 days supply | Qty: 30 | Fill #0

## 2016-09-02 NOTE — Assessment & Plan Note (Signed)
Screening lipid panel revealed elevated cholesterol and LDL.  Lipid Panel     Component Value Date/Time   CHOL 232 (H) 09/01/2016 1017   TRIG 208 (H) 09/01/2016 1017   HDL 37 (L) 09/01/2016 1017   CHOLHDL 6.3 (H) 09/01/2016 1017   LDLCALC 153 (H) 09/01/2016 1017   ASCVD 10 year risk is 32.8%  Plan: - Start moderate intensity statin - Pravachol 40mg  daily - Start Aspirin 81 mg daily for primary prevention

## 2016-09-02 NOTE — Addendum Note (Signed)
Addended by: Sharene SkeansJOHNSON, Mckale Haffey G on: 09/02/2016 08:43 AM   Modules accepted: Orders

## 2016-09-05 NOTE — Progress Notes (Signed)
Internal Medicine Clinic Attending  I saw and evaluated the patient.  I personally confirmed the key portions of the history and exam documented by Dr. Johnson and I reviewed pertinent patient test results.  The assessment, diagnosis, and plan were formulated together and I agree with the documentation in the resident's note.  

## 2016-09-18 ENCOUNTER — Encounter: Payer: Self-pay | Admitting: Internal Medicine

## 2016-09-18 ENCOUNTER — Ambulatory Visit (INDEPENDENT_AMBULATORY_CARE_PROVIDER_SITE_OTHER): Payer: Self-pay | Admitting: Internal Medicine

## 2016-09-18 VITALS — BP 123/82 | HR 97 | Temp 98.5°F | Ht 64.0 in | Wt 131.4 lb

## 2016-09-18 DIAGNOSIS — I10 Essential (primary) hypertension: Secondary | ICD-10-CM

## 2016-09-18 DIAGNOSIS — G8929 Other chronic pain: Secondary | ICD-10-CM

## 2016-09-18 DIAGNOSIS — M79604 Pain in right leg: Secondary | ICD-10-CM

## 2016-09-18 DIAGNOSIS — F1210 Cannabis abuse, uncomplicated: Secondary | ICD-10-CM

## 2016-09-18 DIAGNOSIS — F1120 Opioid dependence, uncomplicated: Secondary | ICD-10-CM

## 2016-09-18 DIAGNOSIS — F17210 Nicotine dependence, cigarettes, uncomplicated: Secondary | ICD-10-CM

## 2016-09-18 DIAGNOSIS — M79605 Pain in left leg: Secondary | ICD-10-CM

## 2016-09-18 MED ORDER — TRAMADOL HCL 50 MG PO TABS: 50.0000 mg | ORAL_TABLET | Freq: Two times a day (BID) | ORAL | 0 refills | Status: DC | PRN

## 2016-09-18 MED ORDER — AMLODIPINE BESYLATE 10 MG PO TABS: 10.0000 mg | ORAL_TABLET | Freq: Every day | ORAL | 3 refills | Status: AC

## 2016-09-18 MED ORDER — DULOXETINE HCL 60 MG PO CPEP: ORAL_CAPSULE | ORAL | 5 refills | Status: DC

## 2016-09-18 MED FILL — traMADol HCL 50 MG TABS: 50 | 30 days supply | Qty: 60 | Fill #0

## 2016-09-18 NOTE — Progress Notes (Signed)
    CC: follow up for HTN and leg pain  HPI: [name] is a 60 y.o. female with PMHx of HTN who presents to the clinic for follow up for HTN and chronic leg pain.   Patient was seen on 09/01/16 with complaint of chronic pain in her legs extending from her calves to her thighs. Taken from notes and confirmed with patient "This pain wakes her up at night and is worse with activity - making it hard for her to function around the house and running errands during the day. She reports chronic pain of her thighs, knees, calves bilaterally that she characterizes as a "deep bone pain." She has had this pain for decades and it impairs her ability to walk (worse with movement) and function at home and outside her home. She reports the pain is constant, worse at no particular time of day sharp/aching, severe, and when it gets triggered/exacerbated she has to stop and sit or lay down, use her heating pad, and take OTC medication. She reports her pain used to be better controlled when she was seeing another doctor here in [place] and was getting Rx Norco and Carisoprodol Production manager(Soma)." This was confirmed on  Database. She smokes marijuana often for pain control and also has received Vicodin from a friend last taken on Christmas. She also uses a heating pad nightly under her legs and this cause caused chronic skin discoloration and signs of breakdown. She reports that providers have tried Gabapentin and Lyrica in the past with little to no effect. She states NSAIDs do not work and she has tried them all.At last visit, patient was prescribed Tramadol 50 mg #30 and Cymbalta 60 mg QD. She states that her pain is well controlled on this regimen and she would like to continue the Tramadol.    Past Medical History:  Diagnosis Date  . Arthritis   . Chronic pain of lower extremity, bilateral   . Chronic sinus congestion   . Hypertension   . Tobacco abuse     Review of Systems: Please see pertinent ROS reviewed  in HPI and problem based charting.   Physical Exam: Vitals:   09/18/16 1321  BP: 123/82  Pulse: 97  Temp: 98.5 F (36.9 C)  TempSrc: Oral  SpO2: 98%  Weight: 131 lb 6.4 oz (59.6 kg)  Height: 5\' 4"  (1.626 m)   General: Vital signs reviewed.  Patient is well-developed and well-nourished, in no acute distress and cooperative with exam.  Cardiovascular: Tachycardic, regular rhythm, S1 normal, S2 normal, no murmurs, gallops, or rubs. Pulmonary/Chest: Clear to auscultation bilaterally, no wheezes, rales, or rhonchi. Abdominal: Soft, non-tender, non-distended, BS + Extremities: No lower extremity edema bilaterally,  pulses symmetric and intact bilaterally. Mild tenderness on palpation of calves.  Skin: Bilateral Skin hyperpigmentation and mild erythema of posterior calves extending from inferior posterior knees to superior ankles.  Psychiatric: Pressured and rapid speech  Assessment & Plan:  See encounters tab for problem based medical decision making. Patient discussed with Dr. Rogelia BogaButcher

## 2016-09-18 NOTE — Assessment & Plan Note (Addendum)
BP Readings from Last 3 Encounters:  09/18/16 123/82  09/01/16 (!) 164/94  04/02/16 (!) 124/92    Lab Results  Component Value Date   NA 142 09/01/2016   K 4.1 09/01/2016   CREATININE 0.75 09/01/2016    Assessment: Blood pressure control:  Controlled Progress toward BP goal:   At goal Comments: Compliant with amlodipine 10 mg QD  Plan: Medications:  continue current medications Other plans: Follow up in one month

## 2016-09-18 NOTE — Patient Instructions (Signed)
Continue current medications as prescribed.  Prior to your next refill in one month, we need to obtain the xrays of your legs and the ABIs of your legs. Please know that you will need to choose between Marijuana and the pain medication, because we cannot prescribed pain medication if you are smoking marijuana. Do not use anyone else's prescription medications either.   Follow up in one month with your Primary Care Doctor here, Dr. Earlene PlaterWallace.

## 2016-09-18 NOTE — Assessment & Plan Note (Addendum)
Patient was seen on 09/01/16 with complaint of chronic pain in her legs extending from her calves to her thighs. Taken from notes and confirmed with patient "This pain wakes her up at night and is worse with activity - making it hard for her to function around the house and running errands during the day. She reports chronic pain of her thighs, knees, calves bilaterally that she characterizes as a "deep bone pain." She has had this pain for decades and it impairs her ability to walk (worse with movement) and function at home and outside her home. She reports the pain is constant, worse at no particular time of day sharp/aching, severe, and when it gets triggered/exacerbated she has to stop and sit or lay down, use her heating pad, and take OTC medication. She reports her pain used to be better controlled when she was seeing another doctor here in [place] and was getting Rx Norco and Carisoprodol Production manager(Soma)." This was confirmed on  Database. She smokes marijuana often for pain control and also has received Vicodin from a friend last taken on Christmas. She also uses a heating pad nightly under her legs and this cause caused chronic skin discoloration and signs of breakdown. She reports that providers have tried Gabapentin and Lyrica in the past with little to no effect. She states NSAIDs do not work and she has tried them all.At last visit, patient was prescribed Tramadol 50 mg #30 and Cymbalta 60 mg QD. She states that her pain is well controlled on this regimen and she would like to continue the Tramadol. Patient did not get the xrays or the ABIs completed. She is able to get this done with no cost as she has the orange card with the letter excusing her from 100% of the costs.   We had a long discussion about how she must choose between marijuana and tramadol. She states she will quit marijuana because the tramadol works for her. She also knows not to use other peoples medications or to share her own. Patient  also need to complete the ABIs and the xrays.   Plan: -UDS -Refilled Tramadol 50 mg BID prn #60 -Continue Cymbalta 60 mg QD and Tylenol -Follow up in one month for refill and establish pain contract with PCP if he agrees -Narcotic Database checked and is as expected -Consider skin biopsy in the future of posterior calves as this is unclear if really due to chronic use of heating pad

## 2016-09-23 NOTE — Progress Notes (Signed)
Internal Medicine Clinic Attending  Case discussed with Dr. Lawerance BachBurns at the time of the visit.  We reviewed the resident's history and exam and pertinent patient test results.  I agree with the assessment, diagnosis, and plan of care documented in the resident's note. Needs to complete ABI 's and plain films.

## 2016-09-27 LAB — TOXASSURE SELECT,+ANTIDEPR,UR

## 2016-09-30 MED FILL — DULoxetine HCL 30 MG CPEP: 30 | 30 days supply | Qty: 60 | Fill #1

## 2016-09-30 MED FILL — AMLODIPINE BESYLATE 10 MG T: 10 | 30 days supply | Qty: 30 | Fill #5

## 2016-10-20 ENCOUNTER — Other Ambulatory Visit: Payer: Self-pay | Admitting: Internal Medicine

## 2016-10-20 DIAGNOSIS — F1120 Opioid dependence, uncomplicated: Secondary | ICD-10-CM

## 2016-10-23 ENCOUNTER — Encounter: Payer: Self-pay | Admitting: Internal Medicine

## 2016-10-23 NOTE — Telephone Encounter (Signed)
Called pt -stated she knows Tramadol will not be refilled until she sees you. She has an appt on the 22nd.

## 2016-10-29 MED FILL — DULoxetine HCL 30 MG CPEP: 30 | 30 days supply | Qty: 60 | Fill #2

## 2016-10-29 MED FILL — AMLODIPINE BESYLATE 10 MG T: 10 | 30 days supply | Qty: 30 | Fill #0

## 2016-11-05 MED FILL — PRAVASTATIN NA 40 MG TAB: 40 | 30 days supply | Qty: 30 | Fill #1

## 2016-11-13 ENCOUNTER — Ambulatory Visit (INDEPENDENT_AMBULATORY_CARE_PROVIDER_SITE_OTHER): Payer: Self-pay | Admitting: Internal Medicine

## 2016-11-13 ENCOUNTER — Encounter (INDEPENDENT_AMBULATORY_CARE_PROVIDER_SITE_OTHER): Payer: Self-pay

## 2016-11-13 VITALS — BP 115/80 | HR 87 | Temp 98.3°F | Wt 132.1 lb

## 2016-11-13 DIAGNOSIS — Z1211 Encounter for screening for malignant neoplasm of colon: Secondary | ICD-10-CM

## 2016-11-13 DIAGNOSIS — Z720 Tobacco use: Secondary | ICD-10-CM | POA: Insufficient documentation

## 2016-11-13 DIAGNOSIS — F1120 Opioid dependence, uncomplicated: Secondary | ICD-10-CM

## 2016-11-13 DIAGNOSIS — F1290 Cannabis use, unspecified, uncomplicated: Secondary | ICD-10-CM

## 2016-11-13 DIAGNOSIS — G8929 Other chronic pain: Secondary | ICD-10-CM

## 2016-11-13 DIAGNOSIS — Z0000 Encounter for general adult medical examination without abnormal findings: Secondary | ICD-10-CM

## 2016-11-13 DIAGNOSIS — Z1231 Encounter for screening mammogram for malignant neoplasm of breast: Secondary | ICD-10-CM

## 2016-11-13 DIAGNOSIS — I10 Essential (primary) hypertension: Secondary | ICD-10-CM

## 2016-11-13 DIAGNOSIS — Z79899 Other long term (current) drug therapy: Secondary | ICD-10-CM

## 2016-11-13 DIAGNOSIS — Z9189 Other specified personal risk factors, not elsewhere classified: Secondary | ICD-10-CM

## 2016-11-13 DIAGNOSIS — F17210 Nicotine dependence, cigarettes, uncomplicated: Secondary | ICD-10-CM

## 2016-11-13 MED ORDER — TRAMADOL HCL 50 MG PO TABS: 50.0000 mg | ORAL_TABLET | Freq: Four times a day (QID) | ORAL | 2 refills | Status: DC | PRN

## 2016-11-13 MED ORDER — DULOXETINE HCL 60 MG PO CPEP: 60.0000 mg | ORAL_CAPSULE | Freq: Every day | ORAL | 5 refills | Status: DC

## 2016-11-13 NOTE — Progress Notes (Signed)
CC: here for BP f/u and pain medication refills  HPI:  [name] is a 61 y.o. woman with a past medical history listed below here today for follow up of her HTN and for pain medication refills.  For details of today's visit and the status of her chronic medical issues please refer to the assessment and plan.   Past Medical History:  Diagnosis Date  . Arthritis   . Chronic pain of lower extremity, bilateral   . Chronic sinus congestion   . Hypertension   . Tobacco abuse     Review of Systems:  Please see pertinent ROS reviewed in HPI and problem based charting.   Physical Exam:  Vitals:   11/13/16 1507  BP: 115/80  Pulse: 87  Temp: 98.3 F (36.8 C)  TempSrc: Oral  SpO2: 98%  Weight: 132 lb 1.6 oz (59.9 kg)   Physical Exam  Constitutional: She is oriented to person, place, and time and well-developed, well-nourished, and in no distress.  Cardiovascular: Normal rate and regular rhythm.   Pulmonary/Chest: Effort normal and breath sounds normal.  Musculoskeletal: She exhibits no edema, tenderness or deformity.  Neurological: She is alert and oriented to person, place, and time.  Skin: Skin is warm and dry.  Psychiatric: Mood and affect normal.     Assessment & Plan:   See Encounters Tab for problem based charting.  Patient discussed with Dr. Oswaldo Sawyer.  Essential hypertension BP Readings from Last 3 Encounters:  11/13/16 115/80  09/18/16 123/82  09/01/16 (!) 164/94   A: BP is well-controlled today on single drug regimen of amlodipine 10mg  daily.   P:  - continue current medications  Healthcare maintenance A/P: - she declines flu vaccine and Pap smear today - she is due for mammogram and order has been placed. - she was given stool cards today  Opioid dependence (HCC) A: She has not obtained her xrays and ABIs ordered previously but states she did not realize that she would be able to walk-in to do so and thought they were going to call her for  an appointment.  She acknowledges her last UDS was inappropriate and offered the following explanations of admitting to taking her friends Vicodin and Codeine in the couple days leading up to her UDS.  She also admitted to smoking marijuana, and believes that the marijuana she smoked was laced with cocaine.  However, it does not explain some of the other discrepancies noted on her UDS.  She reports the Tramadol and and Duloxetine are currently controlling her pain and wishes to continue with this management. She is agreeable to a pain contract and obtaining the imaging studies that have been ordered.  P: - she has some red flags to chronic pain medication management with her UDS and not having followed up with imaging studies that have been ordered.  However, she was agreeable to signing a contract, which was done today, and reports abstaining from marijuana since her last visit.  She does report her husband smokes marijuana but not in the same room as her. - continue duloxetine 60mg  daily and tylenol PRN - she was given a refill of her Tramadol today with 2 refills.  I have asked her to follow up in 3 months to reassess her pain and ensure that she has done the imaging ordered - in hindsight, it would have been prudent to collect a UDS from her today.  Therefore, I have called her and asked her to stop by the  clinic to do so in the coming days.  A voice message was left for her but will attempt to call her again tomorrow.  A future lab order will be placed.  Tobacco abuse A: She continues to smoke about 0.5 packs per day.  She states she will attempt to cut back but declines information about the Raven Sawyer today.  P: - discussed benefits of tobacco cessation - continue to address at follow up.

## 2016-11-13 NOTE — Assessment & Plan Note (Signed)
A: She has not obtained her xrays and ABIs ordered previously but states she did not realize that she would be able to walk-in to do so and thought they were going to call her for an appointment.  She acknowledges her last UDS was inappropriate and offered the following explanations of admitting to taking her friends Vicodin and Codeine in the couple days leading up to her UDS.  She also admitted to smoking marijuana, and believes that the marijuana she smoked was laced with cocaine.  However, it does not explain some of the other discrepancies noted on her UDS.  She reports the Tramadol and and Duloxetine are currently controlling her pain and wishes to continue with this management. She is agreeable to a pain contract and obtaining the imaging studies that have been ordered.  P: - she has some red flags to chronic pain medication management with her UDS and not having followed up with imaging studies that have been ordered.  However, she was agreeable to signing a contract, which was done today, and reports abstaining from marijuana since her last visit.  She does report her husband smokes marijuana but not in the same room as her. - continue duloxetine 60mg  daily and tylenol PRN - she was given a refill of her Tramadol today with 2 refills.  I have asked her to follow up in 3 months to reassess her pain and ensure that she has done the imaging ordered - in hindsight, it would have been prudent to collect a UDS from her today.  Therefore, I have called her and asked her to stop by the clinic to do so in the coming days.  A voice message was left for her but will attempt to call her again tomorrow.  A future lab order will be placed.

## 2016-11-13 NOTE — Assessment & Plan Note (Signed)
A: She continues to smoke about 0.5 packs per day.  She states she will attempt to cut back but declines information about the [place] [employer]uitline today.  P: - discussed benefits of tobacco cessation - continue to address at follow up.

## 2016-11-13 NOTE — Assessment & Plan Note (Signed)
BP Readings from Last 3 Encounters:  11/13/16 115/80  09/18/16 123/82  09/01/16 (!) 164/94   A: BP is well-controlled today on single drug regimen of amlodipine 10mg  daily.   P:  - continue current medications

## 2016-11-13 NOTE — Patient Instructions (Signed)
Thank you for coming to see me today. It was a pleasure. Today we talked about:   I have given you refills on your Tramadol.  Please take this with your Cymbalta as directed.  I have ordered you to complete a mammogram.  I have given you stool cards.  Please get your studies done of your legs that have been ordered.   Please follow-up with me in 3 months.  If you have any questions or concerns, please do not hesitate to call the office at [phone].  Take Care,   Gwynn BurlyAndrew Saree Krogh, DO

## 2016-11-13 NOTE — Assessment & Plan Note (Signed)
A/P: - she declines flu vaccine and Pap smear today - she is due for mammogram and order has been placed. - she was given stool cards today

## 2016-11-14 ENCOUNTER — Telehealth: Payer: Self-pay | Admitting: Internal Medicine

## 2016-11-14 MED FILL — traMADol HCL 50 MG TABS: 50 | 20 days supply | Qty: 60 | Fill #0

## 2016-11-14 NOTE — Telephone Encounter (Signed)
  Reason for call:   I placed an outgoing call to Ms. Maxine GlennBrenda C Liptak at 1:25 PM regarding the need for a urine sample based on her office visit with me yesterday 11/14/2015.   Assessment/ Plan:   I was able to speak directly to Ms Elizebeth BrookingCotton and explained that I did indeed need a urine sample from her and apologized for the inconvenience of not obtaining it at her visit.    I asked her to come by clinic next week as she is unable to come by the end of the day today.    I explained to her that she does not need a physician appointment and that a lab order has already been placed for her to complete [facility].  I suggested she do this at the same time that she complete her x-rays that have been ordered.  She expressed understanding and a willingness to complete this.  As always, pt is asked to call back to clinic with any questions/concerns.   Gwynn BurlyAndrew Glendale Wherry, DO   11/14/2016, 1:25 PM

## 2016-11-18 NOTE — Progress Notes (Signed)
Internal Medicine Clinic Attending  Case discussed with Dr. Earlene PlaterWallace soon after the resident saw the patient.  We reviewed the resident's history and exam and pertinent patient test results.  I agree with the assessment, diagnosis, and plan of care documented in the resident's note.  High risk patient with a chronic pain generator that is likely fibromyalgia and polysubstance abuse. I do not think she would be appropriate for any opioid stronger than tramadol. I think we can incorporate non-opioid medications like gabapentin in the future. Trigger point injections are also an option. If she continues to illicitly abuse opioids like methadone and vicodin, she may be a candidate for OUD treatment with suboxone, which may also help chronic pain. We will need to keep very close follow up of her.

## 2016-11-19 ENCOUNTER — Other Ambulatory Visit (INDEPENDENT_AMBULATORY_CARE_PROVIDER_SITE_OTHER): Payer: Self-pay

## 2016-11-19 DIAGNOSIS — Z1211 Encounter for screening for malignant neoplasm of colon: Secondary | ICD-10-CM

## 2016-11-19 LAB — POC HEMOCCULT BLD/STL (HOME/3-CARD/SCREEN)
Card #3 Fecal Occult Blood, POC: NEGATIVE
FECAL OCCULT BLD: NEGATIVE
FECAL OCCULT BLD: NEGATIVE

## 2016-11-19 NOTE — Addendum Note (Signed)
Addended by: Bufford SpikesFULCHER, [place] N on: 11/19/2016 04:19 PM   Modules accepted: Orders

## 2016-11-20 ENCOUNTER — Other Ambulatory Visit: Payer: Self-pay

## 2016-11-20 ENCOUNTER — Ambulatory Visit (HOSPITAL_COMMUNITY)
Admission: RE | Admit: 2016-11-20 | Discharge: 2016-11-20 | Disposition: A | Discharge status: Home / Self Care | Payer: Self-pay | Source: Ambulatory Visit | Attending: Internal Medicine | Admitting: Internal Medicine

## 2016-11-20 ENCOUNTER — Other Ambulatory Visit: Payer: Self-pay | Admitting: *Deleted

## 2016-11-20 DIAGNOSIS — G894 Chronic pain syndrome: Secondary | ICD-10-CM

## 2016-11-20 DIAGNOSIS — F1120 Opioid dependence, uncomplicated: Secondary | ICD-10-CM

## 2016-11-20 NOTE — Progress Notes (Signed)
Pt here to have imaging of lower extremities-radiology unable to use order on chart as is has expired. New order placed, per radiology's request.Raven Turay Cassady3/1/20182:06 PM

## 2016-11-25 MED FILL — DULoxetine HCL 30 MG CPEP: 30 | 30 days supply | Qty: 60 | Fill #3

## 2016-11-26 LAB — TOXASSURE SELECT,+ANTIDEPR,UR

## 2016-12-01 MED FILL — ?AMLODIPINE BESYLATE 10 MG: 10 | 30 days supply | Qty: 30 | Fill #1

## 2016-12-01 MED FILL — traMADol HCL 50 MG TABS: 50 | 15 days supply | Qty: 60 | Fill #1

## 2016-12-10 ENCOUNTER — Other Ambulatory Visit: Payer: Self-pay | Admitting: Internal Medicine

## 2016-12-10 DIAGNOSIS — Z1231 Encounter for screening mammogram for malignant neoplasm of breast: Secondary | ICD-10-CM

## 2016-12-24 MED FILL — PRAVASTATIN NA 40 MG TAB: 40 | 30 days supply | Qty: 30 | Fill #2

## 2016-12-24 MED FILL — DULoxetine HCL 30 MG CPEP: 30 | 15 days supply | Qty: 30 | Fill #4

## 2016-12-30 MED FILL — traMADol HCL 50 MG TABS: 50 | 15 days supply | Qty: 60 | Fill #2

## 2016-12-30 MED FILL — AMLODIPINE BESYLATE 10 MG T: 10 | 30 days supply | Qty: 30 | Fill #2

## 2017-01-14 ENCOUNTER — Other Ambulatory Visit: Payer: Self-pay | Admitting: Internal Medicine

## 2017-01-14 DIAGNOSIS — M79604 Pain in right leg: Principal | ICD-10-CM

## 2017-01-14 DIAGNOSIS — G8929 Other chronic pain: Secondary | ICD-10-CM

## 2017-01-14 DIAGNOSIS — M79605 Pain in left leg: Principal | ICD-10-CM

## 2017-01-14 MED FILL — DULoxetine HCL 60 MG CPEP: 60 | 30 days supply | Qty: 30 | Fill #0

## 2017-01-21 ENCOUNTER — Other Ambulatory Visit: Payer: Self-pay | Admitting: Internal Medicine

## 2017-01-21 DIAGNOSIS — Z1231 Encounter for screening mammogram for malignant neoplasm of breast: Secondary | ICD-10-CM

## 2017-01-29 ENCOUNTER — Ambulatory Visit: Payer: Self-pay

## 2017-01-30 MED FILL — DULoxetine HCL 60 MG CPEP: 60 | 30 days supply | Qty: 60 | Fill #1

## 2017-02-02 MED FILL — AMLODIPINE BESYLATE 10 MG T: 10 | 30 days supply | Qty: 30 | Fill #3

## 2017-02-04 ENCOUNTER — Ambulatory Visit
Admission: RE | Admit: 2017-02-04 | Discharge: 2017-02-04 | Disposition: A | Discharge status: Home / Self Care | Payer: No Typology Code available for payment source | Source: Ambulatory Visit | Attending: Student in an Organized Health Care Education/Training Program | Admitting: Student in an Organized Health Care Education/Training Program

## 2017-02-04 DIAGNOSIS — Z1231 Encounter for screening mammogram for malignant neoplasm of breast: Secondary | ICD-10-CM

## 2017-02-06 ENCOUNTER — Ambulatory Visit: Payer: Self-pay

## 2017-02-12 ENCOUNTER — Encounter: Payer: Self-pay | Admitting: Internal Medicine

## 2017-03-02 ENCOUNTER — Other Ambulatory Visit: Payer: Self-pay | Admitting: Internal Medicine

## 2017-03-02 DIAGNOSIS — F1120 Opioid dependence, uncomplicated: Secondary | ICD-10-CM

## 2017-03-02 MED FILL — PRAVASTATIN NA 40 MG TAB: 40 | 30 days supply | Qty: 30 | Fill #3

## 2017-03-02 MED FILL — ?DULOXETINE HCL DR 60 MG CA: 60 MG | 30 days supply | Qty: 60 | Fill #2

## 2017-03-05 ENCOUNTER — Other Ambulatory Visit: Payer: Self-pay | Admitting: Internal Medicine

## 2017-03-09 MED FILL — AMLODIPINE BESYLATE 10 MG T: 10 | 30 days supply | Qty: 30 | Fill #4

## 2017-03-30 MED FILL — DULoxetine HCL 60 MG CPEP: 60 | 15 days supply | Qty: 30 | Fill #3

## 2017-04-08 ENCOUNTER — Emergency Department (HOSPITAL_COMMUNITY): Payer: No Typology Code available for payment source

## 2017-04-08 ENCOUNTER — Emergency Department (HOSPITAL_COMMUNITY)
Admission: EM | Admit: 2017-04-08 | Discharge: 2017-04-08 | Disposition: A | Discharge status: Home / Self Care | Payer: No Typology Code available for payment source | Attending: Emergency Medicine | Admitting: Emergency Medicine

## 2017-04-08 ENCOUNTER — Encounter (HOSPITAL_COMMUNITY): Payer: Self-pay | Admitting: Radiology

## 2017-04-08 DIAGNOSIS — Y9389 Activity, other specified: Secondary | ICD-10-CM | POA: Diagnosis not present

## 2017-04-08 DIAGNOSIS — S299XXA Unspecified injury of thorax, initial encounter: Secondary | ICD-10-CM | POA: Diagnosis present

## 2017-04-08 DIAGNOSIS — Y92410 Unspecified street and highway as the place of occurrence of the external cause: Secondary | ICD-10-CM | POA: Diagnosis not present

## 2017-04-08 DIAGNOSIS — F17210 Nicotine dependence, cigarettes, uncomplicated: Secondary | ICD-10-CM | POA: Insufficient documentation

## 2017-04-08 DIAGNOSIS — Y999 Unspecified external cause status: Secondary | ICD-10-CM | POA: Diagnosis not present

## 2017-04-08 DIAGNOSIS — R109 Unspecified abdominal pain: Secondary | ICD-10-CM | POA: Diagnosis not present

## 2017-04-08 DIAGNOSIS — R51 Headache: Secondary | ICD-10-CM | POA: Insufficient documentation

## 2017-04-08 DIAGNOSIS — I10 Essential (primary) hypertension: Secondary | ICD-10-CM | POA: Diagnosis not present

## 2017-04-08 DIAGNOSIS — Z79899 Other long term (current) drug therapy: Secondary | ICD-10-CM | POA: Diagnosis not present

## 2017-04-08 DIAGNOSIS — S2232XA Fracture of one rib, left side, initial encounter for closed fracture: Secondary | ICD-10-CM | POA: Diagnosis not present

## 2017-04-08 DIAGNOSIS — Z7982 Long term (current) use of aspirin: Secondary | ICD-10-CM | POA: Insufficient documentation

## 2017-04-08 LAB — I-STAT CHEM 8, ED
BUN: 10 mg/dL (ref 6–20)
Calcium, Ion: 1.13 mmol/L — ABNORMAL LOW (ref 1.15–1.40)
Chloride: 103 mmol/L (ref 101–111)
Creatinine, Ser: 0.7 mg/dL (ref 0.44–1.00)
GLUCOSE: 115 mg/dL — AB (ref 65–99)
HCT: 43 % (ref 36.0–46.0)
HEMOGLOBIN: 14.6 g/dL (ref 12.0–15.0)
POTASSIUM: 3.3 mmol/L — AB (ref 3.5–5.1)
Sodium: 139 mmol/L (ref 135–145)
TCO2: 25 mmol/L (ref 0–100)

## 2017-04-08 LAB — CBC WITH DIFFERENTIAL/PLATELET
BASOS PCT: 0 %
Basophils Absolute: 0 10*3/uL (ref 0.0–0.1)
EOS PCT: 1 %
Eosinophils Absolute: 0.1 10*3/uL (ref 0.0–0.7)
HCT: 40.1 % (ref 36.0–46.0)
Hemoglobin: 13.6 g/dL (ref 12.0–15.0)
Lymphocytes Relative: 25 %
Lymphs Abs: 2.6 10*3/uL (ref 0.7–4.0)
MCH: 27.3 pg (ref 26.0–34.0)
MCHC: 33.9 g/dL (ref 30.0–36.0)
MCV: 80.5 fL (ref 78.0–100.0)
MONO ABS: 0.7 10*3/uL (ref 0.1–1.0)
MONOS PCT: 6 %
Neutro Abs: 7.1 10*3/uL (ref 1.7–7.7)
Neutrophils Relative %: 68 %
PLATELETS: 429 10*3/uL — AB (ref 150–400)
RBC: 4.98 MIL/uL (ref 3.87–5.11)
RDW: 13.9 % (ref 11.5–15.5)
WBC: 10.5 10*3/uL (ref 4.0–10.5)

## 2017-04-08 MED ORDER — ONDANSETRON HCL 4 MG/2ML IJ SOLN
4.0000 mg | Freq: Once | INTRAMUSCULAR | Status: AC
  Administered 2017-04-08: 4 mg via INTRAVENOUS
  Filled 2017-04-08: qty 2

## 2017-04-08 MED ORDER — HYDROCODONE-ACETAMINOPHEN 5-325 MG PO TABS: 1.0000 | ORAL_TABLET | Freq: Four times a day (QID) | ORAL | 0 refills | Status: DC | PRN

## 2017-04-08 MED ORDER — MORPHINE SULFATE (PF) 4 MG/ML IV SOLN
4.0000 mg | Freq: Once | INTRAVENOUS | Status: AC
  Administered 2017-04-08: 4 mg via INTRAVENOUS
  Filled 2017-04-08: qty 1

## 2017-04-08 MED ORDER — CYCLOBENZAPRINE HCL 10 MG PO TABS: 10.0000 mg | ORAL_TABLET | Freq: Two times a day (BID) | ORAL | 0 refills | Status: DC | PRN

## 2017-04-08 MED ORDER — IBUPROFEN 600 MG PO TABS: 600.0000 mg | ORAL_TABLET | Freq: Four times a day (QID) | ORAL | 0 refills | Status: AC | PRN

## 2017-04-08 MED ORDER — IOPAMIDOL (ISOVUE-300) INJECTION 61%
INTRAVENOUS | Status: AC
  Administered 2017-04-08: 100 mL via INTRAVENOUS
  Filled 2017-04-08: qty 100

## 2017-04-08 MED ORDER — IOPAMIDOL (ISOVUE-300) INJECTION 61%
100.0000 mL | Freq: Once | INTRAVENOUS | Status: AC | PRN
  Administered 2017-04-08: 100 mL via INTRAVENOUS
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
## 2017-04-08 NOTE — Discharge Instructions (Signed)
Ice your ribs several times a day. Use incentive spirometer every few hours. Take ibuprofen for pain. Norco for severe pain. Flexeril for spasms. Follow up with family doctor as needed.

## 2017-04-22 MED FILL — PRAVASTATIN NA 40 MG TAB: 40 | 30 days supply | Qty: 30 | Fill #4

## 2017-04-22 MED FILL — AMLODIPINE BESYLATE 10 MG T: 10 | 30 days supply | Qty: 30 | Fill #5

## 2017-06-03 MED FILL — AMLODIPINE BESYLATE 10 MG T: 10 | 30 days supply | Qty: 30 | Fill #6

## 2017-07-15 MED FILL — AMLODIPINE BESYLATE 10 MG T: 10 | 30 days supply | Qty: 30 | Fill #7

## 2017-09-03 MED FILL — AMLODIPINE BESYLATE 10 MG T: 10 | 30 days supply | Qty: 30 | Fill #8

## 2017-11-18 ENCOUNTER — Encounter: Payer: Self-pay | Admitting: Internal Medicine

## 2018-02-05 MED FILL — AMLODIPINE BESYLATE 5 MG TA: 5 | 30 days supply | Qty: 30 | Fill #0

## 2018-03-08 MED FILL — AMLODIPINE BESYLATE 5 MG TA: 5 | 30 days supply | Qty: 30 | Fill #1

## 2018-03-20 ENCOUNTER — Encounter: Payer: Self-pay | Admitting: *Deleted

## 2018-04-14 MED FILL — AMLODIPINE BESYLATE 5 MG TA: 5 | 30 days supply | Qty: 30 | Fill #2

## 2018-05-27 MED FILL — AMLODIPINE BESYLATE 5 MG TA: 5 | 30 days supply | Qty: 30 | Fill #3

## 2018-07-16 MED FILL — AMLODIPINE BESYLATE 5 MG TA: 5 | 30 days supply | Qty: 30 | Fill #4

## 2018-09-16 MED FILL — AMLODIPINE BESYLATE 5 MG TA: 5 | 30 days supply | Qty: 30 | Fill #5

## 2018-10-19 MED FILL — AMLODIPINE BESYLATE 5 MG TA: 5 | 30 days supply | Qty: 30 | Fill #5

## 2018-11-16 ENCOUNTER — Encounter: Payer: Self-pay | Admitting: Internal Medicine

## 2019-01-10 MED FILL — AMLODIPINE BESYLATE 5 MG TA: 5 | 30 days supply | Qty: 30 | Fill #0

## 2019-04-11 MED FILL — AMLODIPINE BESYLATE 5 MG TA: 5 | 30 days supply | Qty: 30 | Fill #0

## 2020-06-02 ENCOUNTER — Other Ambulatory Visit (HOSPITAL_COMMUNITY): Payer: Self-pay | Admitting: Nurse Practitioner

## 2020-06-02 DIAGNOSIS — U071 COVID-19: Secondary | ICD-10-CM

## 2020-06-02 NOTE — Progress Notes (Signed)
I connected by phone with Maxine Glenn on 06/02/2020 at 10:25 AM to discuss the potential use of an new treatment for mild to moderate COVID-19 viral infection in non-hospitalized patients.  This patient is a 64 y.o. female that meets the FDA criteria for Emergency Use Authorization of casirivimab\imdevimab.  Has symptoms of covid virus and exposed to covid positive husband  Has mild or moderate COVID-19   Is ? 66 years of age and weighs ? 40 kg  Is NOT hospitalized due to COVID-19  Is NOT requiring oxygen therapy or requiring an increase in baseline oxygen flow rate due to COVID-19  Is within 10 days of symptom onset  Has at least one of the high risk factor(s) for progression to severe COVID-19 and/or hospitalization as defined in EUA.  Specific high risk criteria : Older age (>/= 66 yo)   Sx onset 9/11. Vaccinated.    I have spoken and communicated the following to the patient or parent/caregiver:  1. FDA has authorized the emergency use of casirivimab\imdevimab for the treatment of mild to moderate COVID-19 in adults and pediatric patients with positive results of direct SARS-CoV-2 viral testing who are 47 years of age and older weighing at least 40 kg, and who are at high risk for progressing to severe COVID-19 and/or hospitalization.  2. The significant known and potential risks and benefits of casirivimab\imdevimab, and the extent to which such potential risks and benefits are unknown.  3. Information on available alternative treatments and the risks and benefits of those alternatives, including clinical trials.  4. Patients treated with casirivimab\imdevimab should continue to self-isolate and use infection control measures (e.g., wear mask, isolate, social distance, avoid sharing personal items, clean and disinfect "high touch" surfaces, and frequent handwashing) according to CDC guidelines.   5. The patient or parent/caregiver has the option to accept or refuse  casirivimab\imdevimab .  After reviewing this information with the patient, The patient agreed to proceed with receiving casirivimab\imdevimab infusion and will be provided a copy of the Fact sheet prior to receiving the infusion.Consuello Masse, DNP, AGNP-C [phone] (Infusion Center Hotline)

## 2020-06-03 ENCOUNTER — Ambulatory Visit (HOSPITAL_COMMUNITY)
Admission: RE | Admit: 2020-06-03 | Discharge: 2020-06-03 | Disposition: A | Discharge status: Home / Self Care | Payer: Medicare Other | Source: Ambulatory Visit | Attending: Pulmonary Disease | Admitting: Pulmonary Disease

## 2020-06-03 DIAGNOSIS — Z23 Encounter for immunization: Secondary | ICD-10-CM | POA: Insufficient documentation

## 2020-06-03 DIAGNOSIS — U071 COVID-19: Secondary | ICD-10-CM | POA: Insufficient documentation

## 2020-06-03 MED ORDER — EPINEPHRINE 0.3 MG/0.3ML IJ SOAJ: 0.3000 mg | Freq: Once | INTRAMUSCULAR | Status: DC | PRN

## 2020-06-03 MED ORDER — FAMOTIDINE IN NACL 20-0.9 MG/50ML-% IV SOLN: 20.0000 mg | Freq: Once | INTRAVENOUS | Status: DC | PRN

## 2020-06-03 MED ORDER — METHYLPREDNISOLONE SODIUM SUCC 125 MG IJ SOLR: 125.0000 mg | Freq: Once | INTRAMUSCULAR | Status: DC | PRN

## 2020-06-03 MED ORDER — SODIUM CHLORIDE 0.9 % IV SOLN: INTRAVENOUS | Status: DC | PRN

## 2020-06-03 MED ORDER — SODIUM CHLORIDE 0.9 % IV SOLN
1200.0000 mg | Freq: Once | INTRAVENOUS | Status: AC
  Administered 2020-06-03: 1200 mg via INTRAVENOUS
  Filled 2020-06-03: qty 10

## 2020-06-03 MED ORDER — DIPHENHYDRAMINE HCL 50 MG/ML IJ SOLN: 50.0000 mg | Freq: Once | INTRAMUSCULAR | Status: DC | PRN

## 2020-06-03 MED ORDER — ALBUTEROL SULFATE HFA 108 (90 BASE) MCG/ACT IN AERS: 2.0000 | INHALATION_SPRAY | Freq: Once | RESPIRATORY_TRACT | Status: DC | PRN

## 2020-06-03 NOTE — Discharge Instructions (Signed)

## 2020-06-03 NOTE — Progress Notes (Signed)
  Diagnosis: COVID-19  Physician: Wright, MD  Procedure: [facility] Infusion Clinic Med: casirivimab\imdevimab infusion - Provided patient with casirivimab\imdevimab fact sheet for patients, parents and caregivers prior to infusion.  Complications: No immediate complications noted.  Discharge: Discharged home   Makahla Kiser R Hindy Perrault 06/03/2020   

## 2020-07-05 ENCOUNTER — Other Ambulatory Visit: Payer: Self-pay

## 2020-08-12 ENCOUNTER — Encounter (HOSPITAL_COMMUNITY): Payer: Self-pay | Admitting: Emergency Medicine

## 2020-08-12 ENCOUNTER — Other Ambulatory Visit: Payer: Self-pay

## 2020-08-12 ENCOUNTER — Emergency Department (HOSPITAL_COMMUNITY)
Admission: EM | Admit: 2020-08-12 | Discharge: 2020-08-12 | Disposition: A | Discharge status: Home / Self Care | Payer: Medicare Other | Attending: Emergency Medicine | Admitting: Emergency Medicine

## 2020-08-12 DIAGNOSIS — R519 Headache, unspecified: Secondary | ICD-10-CM | POA: Insufficient documentation

## 2020-08-12 DIAGNOSIS — M549 Dorsalgia, unspecified: Secondary | ICD-10-CM | POA: Insufficient documentation

## 2020-08-12 DIAGNOSIS — M542 Cervicalgia: Secondary | ICD-10-CM | POA: Diagnosis present

## 2020-08-12 DIAGNOSIS — Z5321 Procedure and treatment not carried out due to patient leaving prior to being seen by health care provider: Secondary | ICD-10-CM | POA: Insufficient documentation

## 2020-08-12 NOTE — ED Notes (Signed)
Patient is in the restroom.  

## 2020-08-12 NOTE — ED Triage Notes (Signed)
Patient here from home reporting neck pain, back pain, and headache "all of a sudden" while at [facility]. States that when she got out of the car at [employer] she could only lay on the ground due to pain. Denies fall.

## 2020-08-13 ENCOUNTER — Ambulatory Visit
Admission: EM | Admit: 2020-08-13 | Discharge: 2020-08-13 | Disposition: A | Discharge status: Home / Self Care | Payer: Medicare Other | Attending: Family Medicine | Admitting: Family Medicine

## 2020-08-13 DIAGNOSIS — M549 Dorsalgia, unspecified: Secondary | ICD-10-CM | POA: Diagnosis not present

## 2020-08-13 MED ORDER — CYCLOBENZAPRINE HCL 5 MG PO TABS: 5.0000 mg | ORAL_TABLET | Freq: Two times a day (BID) | ORAL | 0 refills | Status: AC | PRN

## 2020-08-13 MED ORDER — NAPROXEN 500 MG PO TABS: 500.0000 mg | ORAL_TABLET | Freq: Two times a day (BID) | ORAL | 0 refills | Status: AC

## 2020-08-13 NOTE — ED Provider Notes (Signed)
[facility]    CSN: [id number] Arrival date & time: 08/13/20  1320      History   Chief Complaint Chief Complaint  Patient presents with  . Back Pain    HPI Raven Sawyer is a 64 y.o. female  Presenting with her husband for back pain.  States it starts in her neck, it goes down to her back.  Overall history is limited secondary patient's cooperation due to pain.  Has been unable to provide additional history.  Patient denying chest pain, shortness of breath, trauma to area.  Patient went to ER yesterday, the left due to wait time.  Has not taken anything for this, though states when she is received pain medications in the ER in the past it has resolved the issue.  Past Medical History:  Diagnosis Date  . Arthritis   . Chronic pain of lower extremity, bilateral   . Chronic sinus congestion   . Hypertension   . Tobacco abuse     Patient Active Problem List   Diagnosis Date Noted  . Tobacco abuse 11/13/2016  . Hyperlipidemia 09/02/2016  . Healthcare maintenance 09/01/2016  . Essential hypertension 09/01/2016  . Opioid dependence (HCC) 04/03/2016    Past Surgical History:  Procedure Laterality Date  . CESAREAN SECTION    . NASAL SEPTUM SURGERY      OB History   No obstetric history on file.      Home Medications    Prior to Admission medications   Medication Sig Start Date End Date Taking? Authorizing Provider  acetaminophen (TYLENOL) 325 MG tablet Take 650 mg by mouth every 6 (six) hours as needed for mild pain. Reported on 04/02/2016    [provider]  amLODipine (NORVASC) 10 MG tablet Take 1 tablet (10 mg total) by mouth daily. 09/18/16   Burns, Tinnie Gens, MD  cyclobenzaprine (FLEXERIL) 5 MG tablet Take 1 tablet (5 mg total) by mouth 2 (two) times daily as needed for up to 7 days for muscle spasms. 08/13/20 08/20/20  Hall-Potvin, [place], PA-C  DULoxetine (CYMBALTA) 60 MG capsule Take 1 capsule (60 mg total) by mouth daily. 03/05/17    Kathlynn Grate, DO  ibuprofen (ADVIL,MOTRIN) 600 MG tablet Take 1 tablet (600 mg total) by mouth every 6 (six) hours as needed. 04/08/17   Kirichenko, Tatyana, PA-C  naproxen (NAPROSYN) 500 MG tablet Take 1 tablet (500 mg total) by mouth 2 (two) times daily. 08/13/20   Hall-Potvin, [place], PA-C  pravastatin (PRAVACHOL) 40 MG tablet Take 1 tablet (40 mg total) by mouth every evening. 09/02/16 09/02/17  Althia Forts, MD    Family History Family History  Problem Relation Age of Onset  . Arthritis Mother     Social History Social History   Tobacco Use  . Smoking status: Current Every Day Smoker    Packs/day: 0.50    Types: Cigarettes  . Smokeless tobacco: Never Used  Substance Use Topics  . Alcohol use: Not Currently    Comment: occ  . Drug use: Not Currently    Types: Marijuana    Comment: smokes marjuana daily      Allergies   Patient has no known allergies.   Review of Systems Review of Systems  Constitutional: Negative for fatigue and fever.  HENT: Negative for ear pain, sinus pain, sore throat and voice change.   Eyes: Negative for pain, redness and visual disturbance.  Respiratory: Negative for cough and shortness of breath.   Cardiovascular: Negative for chest  pain and palpitations.  Gastrointestinal: Negative for abdominal pain, diarrhea and vomiting.  Musculoskeletal: Positive for back pain and neck pain. Negative for arthralgias and myalgias.  Skin: Negative for rash and wound.  Neurological: Negative for syncope and headaches.     Physical Exam Triage Vital Signs ED Triage Vitals  Enc Vitals Group     BP 08/13/20 1329 (!) 165/129     Pulse Rate 08/13/20 1329 (!) 137     Resp 08/13/20 1329 (!) 24     Temp --      Temp src --      SpO2 08/13/20 1329 96 %     Weight --      Height --      Head Circumference --      Peak Flow --      Pain Score 08/13/20 1330 10     Pain Loc --      Pain Edu? --      Excl. in GC? --    No data found.  Updated  Vital Signs BP (!) 165/129 (BP Location: Right Arm)   Pulse (!) 137   Resp (!) 24   SpO2 96%   Visual Acuity Right Eye Distance:   Left Eye Distance:   Bilateral Distance:    Right Eye Near:   Left Eye Near:    Bilateral Near:     Physical Exam Constitutional:      General: She is not in acute distress. HENT:     Head: Normocephalic and atraumatic.  Eyes:     General: No scleral icterus.    Pupils: Pupils are equal, round, and reactive to light.  Cardiovascular:     Rate and Rhythm: Normal rate.  Pulmonary:     Effort: Pulmonary effort is normal.  Musculoskeletal:     Cervical back: No swelling, deformity or rigidity. Normal range of motion.     Thoracic back: No swelling or deformity. Normal range of motion. No scoliosis.     Lumbar back: No swelling, deformity or spasms. Normal range of motion.     Comments: Overall exam limited secondary patient cooperation due to pain.  Skin:    Coloration: Skin is not jaundiced or pale.  Neurological:     Mental Status: She is alert and oriented to person, place, and time.      UC Treatments / Results  Labs (all labs ordered are listed, but only abnormal results are displayed) Labs Reviewed - No data to display  EKG   Radiology No results found.  Procedures Procedures (including critical care time)  Medications Ordered in UC Medications - No data to display  Initial Impression / Assessment and Plan / UC Course  I have reviewed the triage vital signs and the nursing notes.  Pertinent labs & imaging results that were available during my care of the patient were reviewed by me and considered in my medical decision making (see chart for details).     Patient hypertensive, tachycardic, tachypneic, though consolable.  Patient has variable levels of intensity and pain throughout visit.  History is very limited.  Requesting pain medications which have helped in the past.  Stated we have OTC analgesia and NSAIDs at bedside:  Patient agreeable to trialing muscle relaxers.  Stressed importance of going to ER for further evaluation should symptoms persist or worsen.  Return precautions discussed, pt and husband verbalized understanding and are agreeable to plan. Final Clinical Impressions(s) / UC Diagnoses   Final diagnoses:  Acute  back pain, unspecified back location, unspecified back pain laterality     Discharge Instructions     RICE: rest, ice, compression, elevation as needed for pain.    Heat therapy (hot compress, warm wash rag, hot showers, etc.) can help relax muscles and soothe muscle aches. Cold therapy (ice packs) can be used to help swelling both after injury and after prolonged use of areas of chronic pain/aches.  Pain medication:  500 mg Naprosyn/Aleve (naproxen) every 12 hours with food:  AVOID other NSAIDs while taking this (may have Tylenol).  May take muscle relaxer as needed for severe pain / spasm.  (This medication may cause you to become tired so it is important you do not drink alcohol or operate heavy machinery while on this medication.  Recommend your first dose to be taken before bedtime to monitor for side effects safely)  Important to follow up with specialist(s) below for further evaluation/management if your symptoms persist or worsen.    ED Prescriptions    Medication Sig Dispense Auth. Provider   naproxen (NAPROSYN) 500 MG tablet Take 1 tablet (500 mg total) by mouth 2 (two) times daily. 30 tablet Hall-Potvin, [place], PA-C   cyclobenzaprine (FLEXERIL) 5 MG tablet Take 1 tablet (5 mg total) by mouth 2 (two) times daily as needed for up to 7 days for muscle spasms. 14 tablet Hall-Potvin, [place], PA-C     I have reviewed the PDMP during this encounter.   Hall-Potvin, [place], [place] 08/13/20 1451

## 2020-08-13 NOTE — Discharge Instructions (Signed)
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
## 2020-08-28 ENCOUNTER — Other Ambulatory Visit: Payer: Self-pay | Admitting: Physician Assistant

## 2020-08-28 ENCOUNTER — Other Ambulatory Visit: Payer: Self-pay

## 2020-08-28 ENCOUNTER — Ambulatory Visit
Admission: RE | Admit: 2020-08-28 | Discharge: 2020-08-28 | Disposition: A | Discharge status: Home / Self Care | Payer: Medicare Other | Source: Ambulatory Visit | Attending: Physician Assistant | Admitting: Physician Assistant

## 2020-08-28 DIAGNOSIS — R413 Other amnesia: Secondary | ICD-10-CM

## 2020-08-28 MED ORDER — IOPAMIDOL (ISOVUE-300) INJECTION 61%
75.0000 mL | Freq: Once | INTRAVENOUS | Status: AC | PRN
  Administered 2020-08-28: 75 mL via INTRAVENOUS

## 2021-03-07 ENCOUNTER — Ambulatory Visit: Payer: Medicare Other | Attending: Critical Care Medicine

## 2021-03-07 DIAGNOSIS — Z20822 Contact with and (suspected) exposure to covid-19: Secondary | ICD-10-CM

## 2021-03-08 ENCOUNTER — Telehealth: Payer: Self-pay

## 2021-03-08 LAB — NOVEL CORONAVIRUS, NAA: SARS-CoV-2, NAA: NOT DETECTED

## 2021-03-08 LAB — SARS-COV-2, NAA 2 DAY TAT

## 2021-03-08 NOTE — Telephone Encounter (Signed)
Pt is aware covid test is neg on 03-08-2021

## 2021-05-28 ENCOUNTER — Other Ambulatory Visit: Payer: Self-pay | Admitting: Physician Assistant

## 2021-05-28 ENCOUNTER — Ambulatory Visit
Admission: RE | Admit: 2021-05-28 | Discharge: 2021-05-28 | Disposition: A | Discharge status: Home / Self Care | Payer: Medicare Other | Source: Ambulatory Visit | Attending: Physician Assistant | Admitting: Physician Assistant

## 2021-05-28 DIAGNOSIS — Z01818 Encounter for other preprocedural examination: Secondary | ICD-10-CM

## 2023-04-08 DIAGNOSIS — I10 Essential (primary) hypertension: Secondary | ICD-10-CM | POA: Diagnosis not present

## 2023-04-08 DIAGNOSIS — M79604 Pain in right leg: Secondary | ICD-10-CM | POA: Diagnosis not present

## 2023-04-08 DIAGNOSIS — M797 Fibromyalgia: Secondary | ICD-10-CM | POA: Diagnosis not present
